# Patient Record
Sex: Male | Born: 2000 | Race: Black or African American | Hispanic: No | Marital: Single
Health system: Southern US, Community
[De-identification: ages and names within clinical notes are randomized; demographics above are authoritative.]

## PROBLEM LIST (undated history)

## (undated) DIAGNOSIS — J45909 Unspecified asthma, uncomplicated: Secondary | ICD-10-CM

## (undated) DIAGNOSIS — Z789 Other specified health status: Secondary | ICD-10-CM

## (undated) SURGERY — Surgical Case
Anesthesia: *Unknown

---
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
## 2020-02-19 ENCOUNTER — Other Ambulatory Visit: Payer: Self-pay

## 2020-02-19 ENCOUNTER — Emergency Department (HOSPITAL_COMMUNITY)
Admission: EM | Admit: 2020-02-19 | Discharge: 2020-02-19 | Disposition: A | Discharge status: Home / Self Care | Payer: Medicaid Other | Attending: Emergency Medicine | Admitting: Emergency Medicine

## 2020-02-19 ENCOUNTER — Emergency Department (HOSPITAL_COMMUNITY): Payer: Medicaid Other

## 2020-02-19 DIAGNOSIS — J45909 Unspecified asthma, uncomplicated: Secondary | ICD-10-CM | POA: Diagnosis not present

## 2020-02-19 DIAGNOSIS — Z7951 Long term (current) use of inhaled steroids: Secondary | ICD-10-CM | POA: Diagnosis not present

## 2020-02-19 DIAGNOSIS — Z7722 Contact with and (suspected) exposure to environmental tobacco smoke (acute) (chronic): Secondary | ICD-10-CM | POA: Diagnosis not present

## 2020-02-19 DIAGNOSIS — Z202 Contact with and (suspected) exposure to infections with a predominantly sexual mode of transmission: Secondary | ICD-10-CM | POA: Diagnosis not present

## 2020-02-19 DIAGNOSIS — M25511 Pain in right shoulder: Secondary | ICD-10-CM | POA: Diagnosis present

## 2020-02-19 DIAGNOSIS — Z7952 Long term (current) use of systemic steroids: Secondary | ICD-10-CM | POA: Insufficient documentation

## 2020-02-19 LAB — RPR: RPR Ser Ql: NONREACTIVE

## 2020-02-19 LAB — HIV ANTIBODY (ROUTINE TESTING W REFLEX): HIV Screen 4th Generation wRfx: NONREACTIVE

## 2020-02-19 MED ORDER — DOXYCYCLINE HYCLATE 100 MG PO TABS
100.0000 mg | ORAL_TABLET | Freq: Once | ORAL | Status: AC
  Administered 2020-02-19: 100 mg via ORAL
  Filled 2020-02-19: qty 1

## 2020-02-19 MED ORDER — DOXYCYCLINE HYCLATE 100 MG PO CAPS: 100.0000 mg | ORAL_CAPSULE | Freq: Two times a day (BID) | ORAL | 0 refills | Status: DC

## 2020-02-19 MED ORDER — NAPROXEN 500 MG PO TABS: 500.0000 mg | ORAL_TABLET | Freq: Two times a day (BID) | ORAL | 0 refills | Status: DC

## 2020-02-19 MED ORDER — NAPROXEN 250 MG PO TABS
500.0000 mg | ORAL_TABLET | Freq: Once | ORAL | Status: AC
  Administered 2020-02-19: 500 mg via ORAL
  Filled 2020-02-19: qty 2

## 2020-02-19 NOTE — Progress Notes (Signed)
Orthopedic Tech Progress Note Patient Details:  Steven Adams 09-Apr-2000 [id number]  Ortho Devices Type of Ortho Device: Sling immobilizer Ortho Device/Splint Location: RUE Ortho Device/Splint Interventions: Application,Adjustment   Post Interventions Patient Tolerated: Well Instructions Provided: Adjustment of device   [name] [name] 02/19/2020, 2:56 AM

## 2020-02-19 NOTE — Discharge Instructions (Addendum)
Apply ice to your shoulder for 30 minutes at a time, 4 times a day.  Wear the sling as needed.  However, make sure to put your shoulder through a full range of motion at least 4 times a day.  This is to prevent you from developing a frozen shoulder.  Follow-up with the orthopedic physician for further evaluation of your shoulder.

## 2020-02-19 NOTE — ED Notes (Signed)
Patient verbalized understanding of discharge instructions. Opportunity for questions and answers.  
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
## 2020-02-19 NOTE — ED Notes (Signed)
Patient transported to X-ray 
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
## 2020-02-19 NOTE — ED Notes (Signed)
Pt back from x-ray.

## 2020-02-21 LAB — GC/CHLAMYDIA PROBE AMP (~~LOC~~) NOT AT ARMC
Chlamydia: NEGATIVE
Comment: NEGATIVE
Comment: NORMAL
Neisseria Gonorrhea: NEGATIVE

## 2020-05-13 ENCOUNTER — Emergency Department (HOSPITAL_COMMUNITY)
Admission: EM | Admit: 2020-05-13 | Discharge: 2020-05-14 | Disposition: A | Discharge status: Home / Self Care | Payer: Medicaid Other | Attending: Emergency Medicine | Admitting: Emergency Medicine

## 2020-05-13 ENCOUNTER — Other Ambulatory Visit: Payer: Self-pay

## 2020-05-13 ENCOUNTER — Encounter (HOSPITAL_COMMUNITY): Payer: Self-pay

## 2020-05-13 DIAGNOSIS — T1491XA Suicide attempt, initial encounter: Secondary | ICD-10-CM | POA: Diagnosis not present

## 2020-05-13 DIAGNOSIS — R45851 Suicidal ideations: Secondary | ICD-10-CM

## 2020-05-13 DIAGNOSIS — S31139A Puncture wound of abdominal wall without foreign body, unspecified quadrant without penetration into peritoneal cavity, initial encounter: Secondary | ICD-10-CM | POA: Insufficient documentation

## 2020-05-13 DIAGNOSIS — J45909 Unspecified asthma, uncomplicated: Secondary | ICD-10-CM | POA: Diagnosis not present

## 2020-05-13 DIAGNOSIS — Z23 Encounter for immunization: Secondary | ICD-10-CM | POA: Diagnosis not present

## 2020-05-13 DIAGNOSIS — F4325 Adjustment disorder with mixed disturbance of emotions and conduct: Secondary | ICD-10-CM | POA: Diagnosis not present

## 2020-05-13 DIAGNOSIS — F332 Major depressive disorder, recurrent severe without psychotic features: Secondary | ICD-10-CM | POA: Diagnosis present

## 2020-05-13 DIAGNOSIS — X789XXA Intentional self-harm by unspecified sharp object, initial encounter: Secondary | ICD-10-CM | POA: Insufficient documentation

## 2020-05-13 DIAGNOSIS — S51812A Laceration without foreign body of left forearm, initial encounter: Secondary | ICD-10-CM | POA: Insufficient documentation

## 2020-05-13 DIAGNOSIS — Z7951 Long term (current) use of inhaled steroids: Secondary | ICD-10-CM | POA: Diagnosis not present

## 2020-05-13 DIAGNOSIS — Z7722 Contact with and (suspected) exposure to environmental tobacco smoke (acute) (chronic): Secondary | ICD-10-CM | POA: Insufficient documentation

## 2020-05-13 DIAGNOSIS — S59912A Unspecified injury of left forearm, initial encounter: Secondary | ICD-10-CM | POA: Diagnosis present

## 2020-05-13 DIAGNOSIS — S51831A Puncture wound without foreign body of right forearm, initial encounter: Secondary | ICD-10-CM | POA: Insufficient documentation

## 2020-05-13 MED ORDER — DIPHENHYDRAMINE HCL 50 MG/ML IJ SOLN
50.0000 mg | Freq: Once | INTRAMUSCULAR | Status: AC
  Administered 2020-05-14: 50 mg via INTRAMUSCULAR
  Filled 2020-05-13: qty 1

## 2020-05-13 MED ORDER — HALOPERIDOL LACTATE 5 MG/ML IJ SOLN
5.0000 mg | Freq: Once | INTRAMUSCULAR | Status: AC
  Administered 2020-05-13: 5 mg via INTRAMUSCULAR
  Filled 2020-05-13: qty 1

## 2020-05-13 MED ORDER — LORAZEPAM 2 MG/ML IJ SOLN
2.0000 mg | Freq: Once | INTRAMUSCULAR | Status: AC
  Administered 2020-05-13: 2 mg via INTRAMUSCULAR
  Filled 2020-05-13: qty 1

## 2020-05-13 MED ORDER — TETANUS-DIPHTH-ACELL PERTUSSIS 5-2.5-18.5 LF-MCG/0.5 IM SUSY
0.5000 mL | PREFILLED_SYRINGE | Freq: Once | INTRAMUSCULAR | Status: AC
  Administered 2020-05-14: 0.5 mL via INTRAMUSCULAR
  Filled 2020-05-13: qty 0.5

## 2020-05-13 NOTE — ED Provider Notes (Signed)
[facility]-EMERGENCY DEPT Provider Note   CSN: [id number] Arrival date & time: 05/13/20  2247     History Chief Complaint  Patient presents with  . Suicidal  . Extremity Laceration    Torrence Hammack is a 20 y.o. male hx of asthma, depression who presented with suicidal ideation and suicidal attempt.  Patient has been depressed and states that he has been going through a lot.  He told his girlfriend " I am done" and told me that he does not want to live anymore.  He apparently started stabbing himself in the abdomen and his arms.  His girlfriend was concerned and called police to bring him over.  He states that he did not want to be evaluated and wants to just go home and does not understand why he is here.  Patient had previous psych admissions.  He denies any alcohol or drug use currently.  Patient does not remember when his last tetanus shot was  The history is provided by the patient.       Past Medical History:  Diagnosis Date  . Asthma   . Medical history non-contributory     Patient Active Problem List   Diagnosis Date Noted  . MDD (major depressive disorder) 08/04/2018  . MDD (major depressive disorder), recurrent episode (HCC) 08/03/2018    History reviewed. No pertinent surgical history.     History reviewed. No pertinent family history.  Social History   Tobacco Use  . Smoking status: Passive Smoke Exposure - Never Smoker  . Smokeless tobacco: Never Used  Vaping Use  . Vaping Use: Every day  . Substances: THC  Substance Use Topics  . Alcohol use: Yes  . Drug use: Yes    Types: Amphetamines, Benzodiazepines, Marijuana, MDMA (Ecstacy), Oxycodone    Home Medications Prior to Admission medications   Medication Sig Start Date End Date Taking? Authorizing Provider  albuterol (PROVENTIL HFA;VENTOLIN HFA) 108 (90 BASE) MCG/ACT inhaler Inhale 2 puffs into the lungs every 6 (six) hours as needed for wheezing.    [provider]   budesonide-formoterol (SYMBICORT) 80-4.5 MCG/ACT inhaler Inhale 2 puffs into the lungs daily.    [provider]  cetirizine (ZYRTEC) 10 MG tablet Take 10 mg by mouth at bedtime as needed for allergies. 09/14/19   [provider]  doxycycline (VIBRAMYCIN) 100 MG capsule Take 1 capsule (100 mg total) by mouth 2 (two) times daily. One po bid x 7 days 02/19/20   Dione Booze, MD  EPINEPHrine (EPIPEN) 0.3 mg/0.3 mL IJ SOAJ injection Inject 0.3 mLs (0.3 mg total) into the muscle once. Give at first sign of anaphylaxis 08/31/13   Marcellina Millin, MD  fluticasone [facility]) 50 MCG/ACT nasal spray Place 1 spray into both nostrils daily as needed for allergies. 09/14/19   [provider]  naproxen (NAPROSYN) 500 MG tablet Take 1 tablet (500 mg total) by mouth 2 (two) times daily. 02/19/20   Dione Booze, MD    Allergies    Short ragweed pollen ext  Review of Systems   Review of Systems  Skin: Positive for wound.  All other systems reviewed and are negative.   Physical Exam Updated Vital Signs BP 102/64 (BP Location: Right Arm)   Pulse 68   Temp 98.7 F (37.1 C) (Oral)   Resp 18   Ht 5\' 9"  (1.753 m)   Wt 59 kg   SpO2 98%   BMI 19.21 kg/m   Physical Exam Vitals and nursing note reviewed.  Constitutional:      Comments: Agitated  HENT:     Head: Normocephalic.     Nose: Nose normal.     Mouth/Throat:     Mouth: Mucous membranes are moist.  Eyes:     Extraocular Movements: Extraocular movements intact.     Pupils: Pupils are equal, round, and reactive to light.  Cardiovascular:     Rate and Rhythm: Normal rate and regular rhythm.     Pulses: Normal pulses.     Heart sounds: Normal heart sounds.  Pulmonary:     Effort: Pulmonary effort is normal.     Breath sounds: Normal breath sounds.  Abdominal:     General: Abdomen is flat.     Palpations: Abdomen is soft.  Musculoskeletal:     Cervical back: Normal range of motion and neck supple.     Comments:  Patient has multiple superficial cuts on the left forearm  Skin:    Capillary Refill: Capillary refill takes less than 2 seconds.     Comments: Patient has small puncture wounds abdomen and bilateral forearms.  None of them are deep and there is no abdominal tenderness.  There is no fascia exposed or bowel exposed  Neurological:     General: No focal deficit present.     Mental Status: He is oriented to person, place, and time.  Psychiatric:     Comments: Agitated and cursing     ED Results / Procedures / Treatments   Labs (all labs ordered are listed, but only abnormal results are displayed) Labs Reviewed  CBC WITH DIFFERENTIAL/PLATELET - Abnormal; Notable for the following components:      Result Value   MCH 34.1 (*)    Neutro Abs 1.6 (*)    All other components within normal limits  COMPREHENSIVE METABOLIC PANEL - Abnormal; Notable for the following components:   Potassium 3.0 (*)    All other components within normal limits  SALICYLATE LEVEL - Abnormal; Notable for the following components:   Salicylate Lvl <7.0 (*)    All other components within normal limits  ACETAMINOPHEN LEVEL - Abnormal; Notable for the following components:   Acetaminophen (Tylenol), Serum <10 (*)    All other components within normal limits  ETHANOL  RAPID URINE DRUG SCREEN, HOSP PERFORMED    EKG None  Radiology No results found.  Procedures Procedures   Medications Ordered in ED Medications  potassium chloride SA (KLOR-CON) CR tablet 40 mEq (has no administration in time range)  Tdap (BOOSTRIX) injection 0.5 mL (0.5 mLs Intramuscular Given 05/14/20 0000)  LORazepam (ATIVAN) injection 2 mg (2 mg Intramuscular Given 05/13/20 2359)  haloperidol lactate (HALDOL) injection 5 mg (5 mg Intramuscular Given 05/13/20 2359)  diphenhydrAMINE (BENADRYL) injection 50 mg (50 mg Intramuscular Given 05/14/20 0000)    ED Course  I have reviewed the triage vital signs and the nursing notes.  Pertinent labs  & imaging results that were available during my care of the patient were reviewed by me and considered in my medical decision making (see chart for details).    MDM Rules/Calculators/A&P                         Kaydenn Mclear is a 20 y.o. male here presenting with suicidal attempt.  Patient attempted to cut himself multiple times.  None of his cuts are deep and in particular, there is no obvious bowel or fascia exposing the abdomen.  We will update his tetanus  shot.  Patient's lacerations did not need any suturing.  Patient is agitated and cursing.  IVC paperwork filled out by me.  Will give Ativan Haldol and Benadryl for agitation  7:06 AM Patient's labs show potassium 3.0 that was supplemented.  Patient is medically cleared.   Final Clinical Impression(s) / ED Diagnoses Final diagnoses:  None    Rx / DC Orders ED Discharge Orders    None       Charlynne Pander, MD 05/14/20 [id number]

## 2020-05-13 NOTE — ED Notes (Signed)
Patient is screaming "none of yall know me. I dont want to be here right now. I did not want to come to the hospital. What does that have to do with yall that I am stabbing myself." Dr. Silverio Lay at bedside, educating patient about what it means to be IVC'd. Patient saying "fuck that doctor. Give me my shit back now bro yall pissed me off."

## 2020-05-13 NOTE — ED Triage Notes (Signed)
Patient BIB [facility] from a hotel, where he is currently living. His girlfriend called because patient cut himself on both arms and center of his abdomen. 4 puncture wounds. Patient is suicidal. History of bipolar. Patient keeps saying "I am done."  EMS vitals 118/72 HR 80 O2 99%

## 2020-05-13 NOTE — ED Notes (Addendum)
Pt advised that blood work was needed. Pt inquired about how long the whole process would take with his visit. He states " I don't like hospitals and if it is going to take too long then I  may just have to leave." Pt states " man my girlfriend called the ambulance and I told her not to and she did it anyway."

## 2020-05-14 DIAGNOSIS — F332 Major depressive disorder, recurrent severe without psychotic features: Secondary | ICD-10-CM | POA: Diagnosis present

## 2020-05-14 DIAGNOSIS — F4325 Adjustment disorder with mixed disturbance of emotions and conduct: Secondary | ICD-10-CM | POA: Diagnosis present

## 2020-05-14 LAB — COMPREHENSIVE METABOLIC PANEL
ALT: 17 U/L (ref 0–44)
AST: 28 U/L (ref 15–41)
Albumin: 3.8 g/dL (ref 3.5–5.0)
Alkaline Phosphatase: 59 U/L (ref 38–126)
Anion gap: 12 (ref 5–15)
BUN: 7 mg/dL (ref 6–20)
CO2: 27 mmol/L (ref 22–32)
Calcium: 9.1 mg/dL (ref 8.9–10.3)
Chloride: 101 mmol/L (ref 98–111)
Creatinine, Ser: 0.81 mg/dL (ref 0.61–1.24)
GFR, Estimated: >60 mL/min (ref >60)
Glucose, Bld: 85 mg/dL (ref 70–99)
Potassium: 3 mmol/L — ABNORMAL LOW (ref 3.5–5.1)
Sodium: 140 mmol/L (ref 135–145)
Total Bilirubin: 0.8 mg/dL (ref 0.3–1.2)
Total Protein: 6.8 g/dL (ref 6.5–8.1)

## 2020-05-14 LAB — CBC WITH DIFFERENTIAL/PLATELET
Abs Immature Granulocytes: 0.02 10*3/uL (ref 0.00–0.07)
Basophils Absolute: 0 10*3/uL (ref 0.0–0.1)
Basophils Relative: 1 %
Eosinophils Absolute: 0.2 10*3/uL (ref 0.0–0.5)
Eosinophils Relative: 5 %
HCT: 45.8 % (ref 39.0–52.0)
Hemoglobin: 15.8 g/dL (ref 13.0–17.0)
Immature Granulocytes: 0 %
Lymphocytes Relative: 51 %
Lymphs Abs: 2.4 10*3/uL (ref 0.7–4.0)
MCH: 34.1 pg — ABNORMAL HIGH (ref 26.0–34.0)
MCHC: 34.5 g/dL (ref 30.0–36.0)
MCV: 98.9 fL (ref 80.0–100.0)
Monocytes Absolute: 0.4 10*3/uL (ref 0.1–1.0)
Monocytes Relative: 8 %
Neutro Abs: 1.6 10*3/uL — ABNORMAL LOW (ref 1.7–7.7)
Neutrophils Relative %: 35 %
Platelets: 180 10*3/uL (ref 150–400)
RBC: 4.63 MIL/uL (ref 4.22–5.81)
RDW: 13.1 % (ref 11.5–15.5)
WBC: 4.7 10*3/uL (ref 4.0–10.5)
nRBC: 0 % (ref 0.0–0.2)

## 2020-05-14 LAB — SALICYLATE LEVEL: Salicylate Lvl: <7 mg/dL — ABNORMAL LOW (ref 7.0–30.0)

## 2020-05-14 LAB — ACETAMINOPHEN LEVEL: Acetaminophen (Tylenol), Serum: <10 ug/mL — ABNORMAL LOW (ref 10–30)

## 2020-05-14 LAB — ETHANOL: Alcohol, Ethyl (B): <10 mg/dL (ref <10)

## 2020-05-14 MED ORDER — ACETAMINOPHEN 325 MG PO TABS: 650.0000 mg | ORAL_TABLET | Freq: Four times a day (QID) | ORAL | Status: DC | PRN

## 2020-05-14 MED ORDER — POTASSIUM CHLORIDE CRYS ER 20 MEQ PO TBCR
40.0000 meq | EXTENDED_RELEASE_TABLET | Freq: Once | ORAL | Status: AC
  Administered 2020-05-14: 40 meq via ORAL
  Filled 2020-05-14: qty 2

## 2020-05-14 MED ORDER — LORAZEPAM 1 MG PO TABS: 1.0000 mg | ORAL_TABLET | ORAL | Status: DC | PRN

## 2020-05-14 NOTE — [facility] Counselor (Signed)
Attempted assessment.  Per RN's report, Pt is still asleep.  Will attempt assessment later.

## 2020-05-14 NOTE — Discharge Instructions (Signed)
[facility] Mental health clinic in [place], [place] Address: [address], [place], [place] [address] Hours:  Open 24 hours Phone: [phone] 

## 2020-05-14 NOTE — ED Notes (Signed)
Patient changed into burgundy scrubs. 1 gray hoody, 1 black pair of pants, 1 pair of black shoes, 1 cellphone, 1 charging cord.

## 2020-05-14 NOTE — ED Notes (Signed)
Pt sleeping, respirations even and unlabored.

## 2020-05-14 NOTE — BH Assessment (Signed)
Comprehensive Clinical Assessment (CCA) Note  05/14/2020 Steven Adams [id number]  Disposition:  Pt was initially assessed by Molli KnockJ. Lord, DNP, and author then completed TTS assessment.  Per Molli KnockJ. Lord, DNP, Pt does not meet inpatient criteria and may be discharged.  The patient demonstrates the following risk factors for suicide: Chronic risk factors for suicide include: previous suicide attempts 2 and previous self-harm multiple. Acute risk factors for suicide include: N/A. Protective factors for this patient include: positive social support and coping skills. Considering these factors, the overall suicide risk at this point appears to be moderate. Patient is appropriate for outpatient follow up.  Flowsheet Row ED from 05/13/2020 [facility] Admission (Discharged) [facility]  C-SSRS RISK CATEGORY Moderate Risk High Risk     Chief Complaint:  Chief Complaint  Patient presents with  . Suicidal  . Extremity Laceration  . 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  He lives in [place] with a roommate, and he works at [employer].  Pt said he does have a psychaitrist.  Pt reported that he argued with his girlfriend yesterday.  He got upset and cut himself on his arm and abdomen.  Pt denied that this was a suicide attempt.  Pt admitted that he has attempted suicide before (last attempt was 2020).  Pt denied homicidal ideation and hallucination.  Pt endorsed use of marijuana.  Pt stated that he wanted to be discharged.  During assessment, Pt presented as alert and oriented.  He had good eye contact and was cooperative.  Pt was dressed in scrubs, and he was appropriately  groomed.  Pt's mood was generally negative.  Mood was blunted.  Pt's speech was normal in rate, rhythm, and volume.  Thought processes were within normal range, and thought content was logical and goal-oriented.  There was no evidence of delusion.  Pt's memory and concentration were intact.  Insight, judgment, and impulse control were fair.  Per Molli KnockJ. Lord, DNP, Pt is to be discharged.  CCA Screening, Triage and Referral (STR)  Patient Reported Information How did you hear about us? Family/Friend  Referral name: No data recorded Referral phone number: No data recorded  Whom do you see for routine medical problems? Primary Care  Practice/Facility Name: No data recorded Practice/Facility Phone Number: No data recorded Name of Contact: No data recorded Contact Number: No data recorded Contact Fax Number: No data recorded Prescriber Name: No data recorded Prescriber Address (if known): No data recorded  What Is the Reason for Your Visit/Call Today? Pt became upset, cut himself  How Long Has This Been Causing You Problems? <Week  What Do You Feel Would Help You the Most Today? No data recorded  Have You Recently Been in Any Inpatient Treatment (Hospital/Detox/Crisis Center/28-Day Program)? No data recorded Name/Location of Program/Hospital:No data recorded How Long Were You There? No data recorded When Were You Discharged? No data recorded  Have You Ever Received Services From [facility] Before? Yes  [facility]   Have You Recently Had Any Thoughts About Hurting Yourself? Yes  Are You Planning to Commit Suicide/Harm Yourself At This time? No   Have you Recently  Had Thoughts About Hurting Someone Karolee Ohs? No  Explanation: No data recorded  Have You Used Any Alcohol or Drugs in the Past 24 Hours? No  How Long Ago Did You Use Drugs or Alcohol? No data recorded What Did You Use and How Much? No data recorded  Do You Currently Have a Therapist/Psychiatrist?  No  Name of Therapist/Psychiatrist: No data recorded  Have You Been Recently Discharged From Any Office Practice or Programs? No  Explanation of Discharge From Practice/Program: No data recorded    CCA Screening Triage Referral Assessment Type of Contact: Tele-Assessment  Is this Initial or Reassessment? Initial Assessment  Date Telepsych consult ordered in [facility]:  05/14/2020  Time Telepsych consult ordered in [facility]:  No data recorded  Patient Reported Information Reviewed? Yes  Patient Left Without Being Seen? No data recorded Reason for Not Completing Assessment: No data recorded  Collateral Involvement: NA   Does Patient Have a Court Appointed Legal Guardian? No data recorded Name and Contact of Legal Guardian: No data recorded If Minor and Not Living with Parent(s), Who has Custody? No data recorded Is CPS involved or ever been involved? In the Past  Is APS involved or ever been involved? Never   Patient Determined To Be At Risk for Harm To Self or Others Based on Review of Patient Reported Information or Presenting Complaint? No data recorded Method: No data recorded Availability of Means: No data recorded Intent: No data recorded Notification Required: No data recorded Additional Information for Danger to Others Potential: No data recorded Additional Comments for Danger to Others Potential: No data recorded Are There Guns or Other Weapons in Your Home? No data recorded Types of Guns/Weapons: No data recorded Are These Weapons Safely Secured?                            No data recorded Who Could Verify You Are Able To Have These Secured: No data recorded Do You Have any Outstanding Charges, Pending Court Dates, Parole/Probation? No data recorded Contacted To Inform of Risk of Harm To Self or Others: No data recorded  Location of Assessment: [facility]   Does Patient Present under Involuntary Commitment? No  IVC Papers Initial File Date: No data recorded  [place] of  Residence: [place]   Patient Currently Receiving the Following Services: Medication Management   Determination of Need: Urgent (48 hours)   Options For Referral: Outpatient Therapy     CCA Biopsychosocial Intake/Chief Complaint:  Pt cut himself on arm and belly after arguing with girlfriend  Current Symptoms/Problems: No data recorded  Patient Reported Schizophrenia/Schizoaffective Diagnosis in Past: No   Strengths: No data recorded Preferences: No data recorded Abilities: No data recorded  Type of Services Patient Feels are Needed: Pt wants to go home   Initial Clinical Notes/Concerns: Pt denied suicidal ideation, homicidal ideation, hallucination, and substance use   Mental Health Symptoms Depression:  Change in energy/activity   Duration of Depressive symptoms: Greater than two weeks   Mania:  N/A   Anxiety:   N/A   Psychosis:  None   Duration of Psychotic symptoms: No data recorded  Trauma:  N/A   Obsessions:  N/A   Compulsions:  N/A   Inattention:  N/A   Hyperactivity/Impulsivity:  N/A   Oppositional/Defiant Behaviors:  N/A   Emotional Irregularity:  Potentially harmful impulsivity; Recurrent suicidal behaviors/gestures/threats   Other Mood/Personality Symptoms:  No data recorded   Mental Status Exam Appearance and  self-care  Stature:  Average   Weight:  Average weight   Clothing:  Casual   Grooming:  Normal   Cosmetic use:  None   Posture/gait:  Normal   Motor activity:  Not Remarkable   Sensorium  Attention:  Normal   Concentration:  Normal   Orientation:  X5   Recall/memory:  Normal   Affect and Mood  Affect:  Appropriate   Mood:  Negative   Relating  Eye contact:  Normal   Facial expression:  Responsive   Attitude toward examiner:  Cooperative   Thought and Language  Speech flow: Clear and Coherent   Thought content:  Appropriate to Mood and Circumstances   Preoccupation:  None   Hallucinations:  None    Organization:  No data recorded  [employer] of Knowledge:  Average   Intelligence:  Average   Abstraction:  Normal   Judgement:  Fair   Dance movement psychotherapist:  Adequate   Insight:  Fair   Decision Making:  Impulsive   Social Functioning  Social Maturity:  Impulsive   Social Judgement:  Victimized   Stress  Stressors:  Relationship   Coping Ability:  Human resources officer Deficits:  None   Supports:  Family; Friends/Service system     Religion:    Leisure/Recreation:    Exercise/Diet: Exercise/Diet Do You Follow a Special Diet?: No Do You Have Any Trouble Sleeping?: No   CCA Employment/Education Employment/Work Situation: Employment / Work Situation Employment situation: Employed Where is patient currently employed?: [employer] job has been impacted by current illness: No Has patient ever been in the [employer]?: No  Education: Education Is Patient Currently Attending School?: No Did You Have An Individualized Education Program (IIEP): No Did You Have Any Difficulty At [employer]?: No Patient's Education Has Been Impacted by Current Illness: No   CCA Family/Childhood History Family and Relationship History: Family history Are you sexually active?: Yes What is your sexual orientation?: Heterosexual Does patient have children?: No  Childhood History:  Childhood History Did patient suffer any verbal/emotional/physical/sexual abuse as a child?: No Did patient suffer from severe childhood neglect?: No Has patient ever been sexually abused/assaulted/raped as an adolescent or adult?: No Was the patient ever a victim of a crime or a disaster?: No Witnessed domestic violence?: No Has patient been affected by domestic violence as an adult?: No  Child/Adolescent Assessment:     CCA Substance Use Alcohol/Drug Use: Alcohol / Drug Use Pain Medications: Please see MAR Prescriptions: Please see MAR Over the Counter: Please see  MAR History of alcohol / drug use?: Yes Substance #1 Name of Substance 1: THC 1 - Amount (size/oz): Varied 1 - Frequency: Episodic 1 - Duration: Ongoing                       ASAM's:  Six Dimensions of Multidimensional Assessment  Dimension 1:  Acute Intoxication and/or Withdrawal Potential:      Dimension 2:  Biomedical Conditions and Complications:      Dimension 3:  Emotional, Behavioral, or Cognitive Conditions and Complications:     Dimension 4:  Readiness to Change:     Dimension 5:  Relapse, Continued use, or Continued Problem Potential:     Dimension 6:  Recovery/Living Environment:     ASAM Severity Score:    ASAM Recommended Level of Treatment:     Substance use Disorder (SUD)    Recommendations for Services/Supports/Treatments:    DSM5 Diagnoses: Patient  Active Problem List   Diagnosis Date Noted  . 

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
## 2020-05-14 NOTE — ED Notes (Signed)
Pt wanting to go to work. Writer called Molli Knock and spoke with her and gave phone number of person to contact for PT. TTS visit just completed. Pt has been redirectable.

## 2020-05-14 NOTE — ED Provider Notes (Signed)
PT cleared by psychiatry team per Nanine Means, NP. I rescinded IVC.   Akia Montalban, Ambrose Finland, MD 05/14/20 [phone]

## 2020-09-04 IMAGING — DX PORTABLE CHEST - 1 VIEW
1 series · 1 of 1 positions shown · non-contrast
Comparison: 11/05/2012.

CLINICAL DATA: Asthma exacerbation. Chest tightness. Shortness of
breath.

EXAM:
PORTABLE CHEST 1 VIEW

[chest ap]
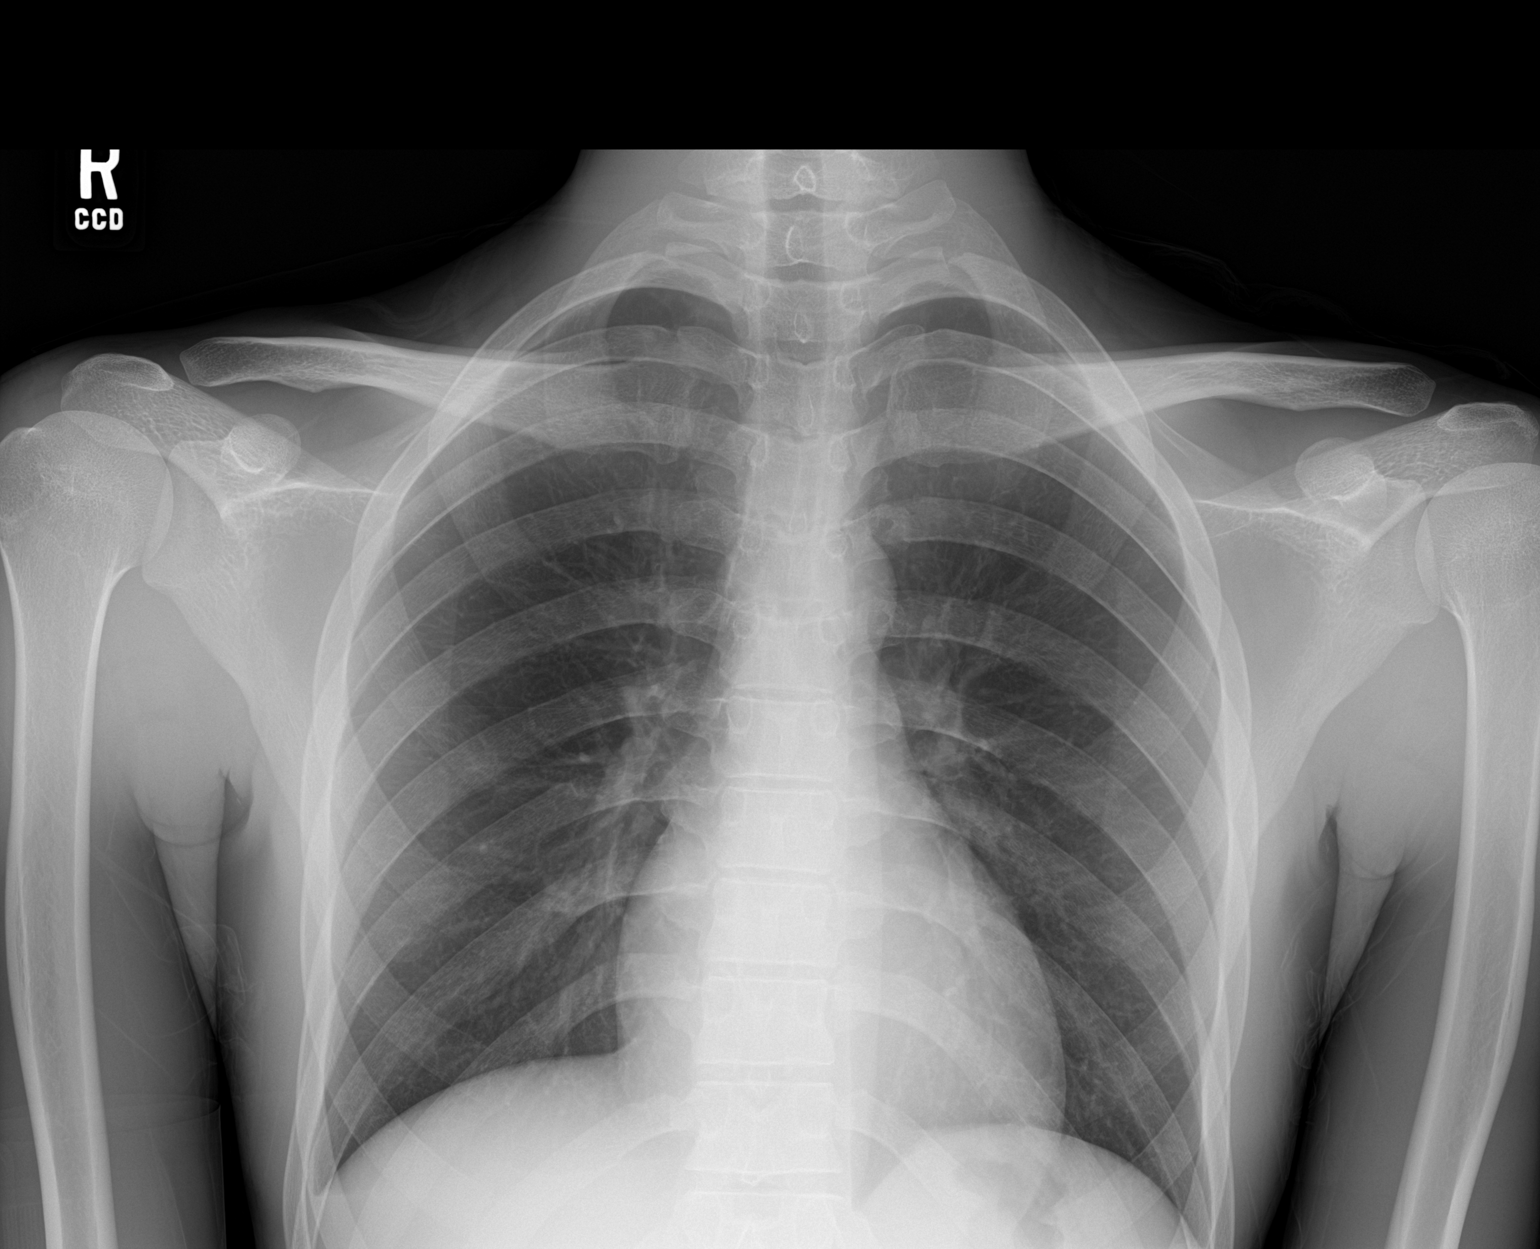

[1 of 1 positions shown; findings below may reference images not displayed]

FINDINGS: Left costophrenic angle incompletely imaged. Mediastinum hilar
structures normal. Lungs are clear of acute infiltrates. No pleural
effusion or pneumothorax. Heart size normal. No acute bony
abnormality.
IMPRESSION: No acute cardiopulmonary disease.
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
## 2021-09-27 ENCOUNTER — Other Ambulatory Visit: Payer: Self-pay

## 2021-09-27 ENCOUNTER — Encounter (HOSPITAL_COMMUNITY): Payer: Self-pay

## 2021-09-27 ENCOUNTER — Emergency Department (HOSPITAL_COMMUNITY)
Admission: EM | Admit: 2021-09-27 | Discharge: 2021-09-28 | Disposition: A | Discharge status: Home / Self Care | Payer: Medicaid Other | Attending: Emergency Medicine | Admitting: Emergency Medicine

## 2021-09-27 DIAGNOSIS — R112 Nausea with vomiting, unspecified: Secondary | ICD-10-CM | POA: Insufficient documentation

## 2021-09-27 DIAGNOSIS — Z7722 Contact with and (suspected) exposure to environmental tobacco smoke (acute) (chronic): Secondary | ICD-10-CM | POA: Insufficient documentation

## 2021-09-27 DIAGNOSIS — R109 Unspecified abdominal pain: Secondary | ICD-10-CM | POA: Insufficient documentation

## 2021-09-27 DIAGNOSIS — J45909 Unspecified asthma, uncomplicated: Secondary | ICD-10-CM | POA: Insufficient documentation

## 2021-09-27 LAB — URINALYSIS, ROUTINE W REFLEX MICROSCOPIC
Bacteria, UA: NONE SEEN
Bilirubin Urine: NEGATIVE
Glucose, UA: NEGATIVE mg/dL
Ketones, ur: 20 mg/dL — AB
Leukocytes,Ua: NEGATIVE
Nitrite: NEGATIVE
Protein, ur: 30 mg/dL — AB
Specific Gravity, Urine: 1.019 (ref 1.005–1.030)
pH: 6 (ref 5.0–8.0)

## 2021-09-27 LAB — RAPID URINE DRUG SCREEN, HOSP PERFORMED
Amphetamines: NOT DETECTED
Barbiturates: NOT DETECTED
Benzodiazepines: NOT DETECTED
Cocaine: NOT DETECTED
Opiates: NOT DETECTED
Tetrahydrocannabinol: POSITIVE — AB

## 2021-09-27 LAB — COMPREHENSIVE METABOLIC PANEL
ALT: 20 U/L (ref 0–44)
AST: 24 U/L (ref 15–41)
Albumin: 4.6 g/dL (ref 3.5–5.0)
Alkaline Phosphatase: 61 U/L (ref 38–126)
Anion gap: 10 (ref 5–15)
BUN: 12 mg/dL (ref 6–20)
CO2: 24 mmol/L (ref 22–32)
Calcium: 10.1 mg/dL (ref 8.9–10.3)
Chloride: 106 mmol/L (ref 98–111)
Creatinine, Ser: 0.77 mg/dL (ref 0.61–1.24)
GFR, Estimated: >60 mL/min (ref >60)
Glucose, Bld: 127 mg/dL — ABNORMAL HIGH (ref 70–99)
Potassium: 3.4 mmol/L — ABNORMAL LOW (ref 3.5–5.1)
Sodium: 140 mmol/L (ref 135–145)
Total Bilirubin: 0.9 mg/dL (ref 0.3–1.2)
Total Protein: 7.8 g/dL (ref 6.5–8.1)

## 2021-09-27 LAB — CBC WITH DIFFERENTIAL/PLATELET
Abs Immature Granulocytes: 0.02 10*3/uL (ref 0.00–0.07)
Basophils Absolute: 0 10*3/uL (ref 0.0–0.1)
Basophils Relative: 0 %
Eosinophils Absolute: 0.1 10*3/uL (ref 0.0–0.5)
Eosinophils Relative: 1 %
HCT: 44.5 % (ref 39.0–52.0)
Hemoglobin: 15.6 g/dL (ref 13.0–17.0)
Immature Granulocytes: 0 %
Lymphocytes Relative: 17 %
Lymphs Abs: 1 10*3/uL (ref 0.7–4.0)
MCH: 33.7 pg (ref 26.0–34.0)
MCHC: 35.1 g/dL (ref 30.0–36.0)
MCV: 96.1 fL (ref 80.0–100.0)
Monocytes Absolute: 0.3 10*3/uL (ref 0.1–1.0)
Monocytes Relative: 4 %
Neutro Abs: 4.6 10*3/uL (ref 1.7–7.7)
Neutrophils Relative %: 78 %
Platelets: 181 10*3/uL (ref 150–400)
RBC: 4.63 MIL/uL (ref 4.22–5.81)
RDW: 12.2 % (ref 11.5–15.5)
WBC: 6 10*3/uL (ref 4.0–10.5)
nRBC: 0 % (ref 0.0–0.2)

## 2021-09-27 LAB — LIPASE, BLOOD: Lipase: 24 U/L (ref 11–51)

## 2021-09-27 MED ORDER — ONDANSETRON 4 MG PO TBDP
4.0000 mg | ORAL_TABLET | Freq: Once | ORAL | Status: AC | PRN
Start: 2021-09-27 — End: 2021-09-27
  Administered 2021-09-27: 4 mg via ORAL
  Filled 2021-09-27: qty 1

## 2021-09-27 NOTE — ED Triage Notes (Signed)
6 episodes of vomiting since ~5PM. C/o generalized abdominal pain.

## 2021-09-27 NOTE — ED Provider Triage Note (Signed)
Emergency Medicine Provider Triage Evaluation Note  Steven Adams , a 21 y.o. male  was evaluated in triage.  Pt complains of vomiting.  This started this afternoon.  No fevers.  No diarrhea.     Physical Exam  BP 118/70   Pulse 62   Temp 98.3 F (36.8 C) (Oral)   Resp 19   Ht 5\' 8"  (1.727 m)   Wt 68 kg   SpO2 100%   BMI 22.81 kg/m  Gen:   Awake, appears uncomfortable Resp:  Normal effort  MSK:   Moves extremities without difficulty  Other:  Normal speech.   Medical Decision Making  Medically screening exam initiated at 10:56 PM.  Appropriate orders placed.  Steven Adams was informed that the remainder of the evaluation will be completed by another provider, this initial triage assessment does not replace that evaluation, and the importance of remaining in the ED until their evaluation is complete.     Quentin Mulling, Cristina Gong 09/27/21 2259

## 2021-09-28 MED ORDER — ONDANSETRON 4 MG PO TBDP: 4.0000 mg | ORAL_TABLET | Freq: Three times a day (TID) | ORAL | 0 refills | Status: AC | PRN

## 2021-09-28 MED ORDER — HALOPERIDOL LACTATE 5 MG/ML IJ SOLN
4.0000 mg | Freq: Once | INTRAMUSCULAR | Status: AC
  Administered 2021-09-28: 4 mg via INTRAMUSCULAR
  Filled 2021-09-28: qty 1

## 2021-09-28 NOTE — ED Provider Notes (Signed)
[facility]-EMERGENCY DEPT [facility] Emergency Department Provider Note MRN:  [id number]  Arrival date & time: 09/28/21     Chief Complaint   Vomiting   History of Present Illness   Steven Adams is a 21 y.o. year-old male with a history of asthma presenting to the ED with chief complaint of vomiting.  Nausea vomiting and abdominal pain for the past several hours.  No fever, no constipation or diarrhea, no chest pain or shortness of breath.  Is never happened before.  Review of Systems  A thorough review of systems was obtained and all systems are negative except as noted in the HPI and PMH.   Patient's Health History    Past Medical History:  Diagnosis Date   Asthma    Medical history non-contributory     History reviewed. No pertinent surgical history.  No family history on file.  Social History   Socioeconomic History   Marital status: Single    Spouse name: Not on file   Number of children: Not on file   Years of education: Not on file   Highest education level: Not on file  Occupational History   Not on file  Tobacco Use   Smoking status: Passive Smoke Exposure - Never Smoker   Smokeless tobacco: Never  Vaping Use   Vaping Use: Every day   Substances: THC  Substance and Sexual Activity   Alcohol use: Yes   Drug use: Yes    Types: Amphetamines, Benzodiazepines, Marijuana, MDMA (Ecstacy), Oxycodone   Sexual activity: Yes    Birth control/protection: None  Other Topics Concern   Not on file  Social History Narrative   Not on file   Social Determinants of Health   Financial Resource Strain: Not on file  Food Insecurity: Not on file  Transportation Needs: Not on file  Physical Activity: Not on file  Stress: Not on file  Social Connections: Not on file  Intimate Partner Violence: Not on file     Physical Exam   Vitals:   09/28/21 0400 09/28/21 0412  BP:  132/76  Pulse: 61 (!) 59  Resp:  20  Temp:    SpO2: 98% 98%    CONSTITUTIONAL:  Well-appearing, NAD NEURO/PSYCH:  Alert and oriented x 3, no focal deficits EYES:  eyes equal and reactive ENT/NECK:  no LAD, no JVD CARDIO: Regular rate, well-perfused, normal S1 and S2 PULM:  CTAB no wheezing or rhonchi GI/GU:  non-distended, non-tender MSK/SPINE:  No gross deformities, no edema SKIN:  no rash, atraumatic   *Additional and/or pertinent findings included in MDM below  Diagnostic and Interventional Summary    EKG Interpretation  Date/Time:    Ventricular Rate:    PR Interval:    QRS Duration:   QT Interval:    QTC Calculation:   R Axis:     Text Interpretation:         Labs Reviewed  COMPREHENSIVE METABOLIC PANEL - Abnormal; Notable for the following components:      Result Value   Potassium 3.4 (*)    Glucose, Bld 127 (*)    All other components within normal limits  URINALYSIS, ROUTINE W REFLEX MICROSCOPIC - Abnormal; Notable for the following components:   Hgb urine dipstick SMALL (*)    Ketones, ur 20 (*)    Protein, ur 30 (*)    All other components within normal limits  RAPID URINE DRUG SCREEN, HOSP PERFORMED - Abnormal; Notable for the following components:   Tetrahydrocannabinol POSITIVE (*)  All other components within normal limits  LIPASE, BLOOD  CBC WITH DIFFERENTIAL/PLATELET    No orders to display    Medications  ondansetron (ZOFRAN-ODT) disintegrating tablet 4 mg (4 mg Oral Given 09/27/21 2254)  haloperidol lactate (HALDOL) injection 4 mg (4 mg Intramuscular Given 09/28/21 0307)     Procedures  /  Critical Care Procedures  ED Course and Medical Decision Making  Initial Impression and Ddx Patient appears generally nauseated but is not in obvious distress.  Normal vital signs.  Abdomen is nontender, soft.  Daily user of marijuana, and so hyperemesis is considered.  With the reassuring abdominal exam there is currently low concern for acute intra-abdominal surgical process.  Awaiting labs, providing symptomatic management.  Past  medical/surgical history that increases complexity of ED encounter: None  Interpretation of Diagnostics I personally reviewed the laboratory assessment and my interpretation is as follows: No significant blood count or electrolyte disturbance    Patient Reassessment and Ultimate Disposition/Management     On reassessment patient is feeling much better, continued benign abdominal exam, appropriate for discharge.  Patient management required discussion with the following services or consulting groups:  None  Complexity of Problems Addressed Acute illness or injury that poses threat of life of bodily function  Additional Data Reviewed and Analyzed Further history obtained from: None  Additional Factors Impacting ED Encounter Risk Use of parenteral controlled substances  Elmer Sow. Pilar Plate, MD [facility] Emergency Medicine [facility] [email or web address]  Final Clinical Impressions(s) / ED Diagnoses     ICD-10-CM   1. Nausea and vomiting, unspecified vomiting type  R11.2       ED Discharge Orders          Ordered    ondansetron (ZOFRAN-ODT) 4 MG disintegrating tablet  Every 8 hours PRN        09/28/21 0435             Discharge Instructions Discussed with and Provided to Patient:     Discharge Instructions      You were evaluated in the Emergency Department and after careful evaluation, we did not find any emergent condition requiring admission or further testing in the hospital.  Your exam/testing today was overall reassuring.  Recommend trying to avoid marijuana or THC products over the next 2 weeks to see if this helps her symptoms.  Can use the Zofran medication provided as needed for nausea.  Please return to the Emergency Department if you experience any worsening of your condition.  Thank you for allowing [place] to be a part of your care.        Sabas Sous, MD 09/28/21 [phone]

## 2021-09-28 NOTE — Discharge Instructions (Signed)
You were evaluated in the Emergency Department and after careful evaluation, we did not find any emergent condition requiring admission or further testing in the hospital.  Your exam/testing today was overall reassuring.  Recommend trying to avoid marijuana or THC products over the next 2 weeks to see if this helps her symptoms.  Can use the Zofran medication provided as needed for nausea.  Please return to the Emergency Department if you experience any worsening of your condition.  Thank you for allowing [place] to be a part of your care.

## 2022-02-17 ENCOUNTER — Emergency Department (HOSPITAL_COMMUNITY): Payer: Self-pay

## 2022-02-17 ENCOUNTER — Inpatient Hospital Stay (HOSPITAL_COMMUNITY)
Admission: EM | Admit: 2022-02-17 | Discharge: 2022-02-25 | DRG: 003 | Disposition: E | Payer: Self-pay | Attending: Pulmonary Disease | Admitting: Pulmonary Disease

## 2022-02-17 ENCOUNTER — Other Ambulatory Visit: Payer: Self-pay

## 2022-02-17 DIAGNOSIS — J9601 Acute respiratory failure with hypoxia: Secondary | ICD-10-CM

## 2022-02-17 DIAGNOSIS — R0489 Hemorrhage from other sites in respiratory passages: Secondary | ICD-10-CM

## 2022-02-17 DIAGNOSIS — R579 Shock, unspecified: Secondary | ICD-10-CM

## 2022-02-17 DIAGNOSIS — W3400XA Accidental discharge from unspecified firearms or gun, initial encounter: Principal | ICD-10-CM

## 2022-02-17 DIAGNOSIS — J80 Acute respiratory distress syndrome: Secondary | ICD-10-CM

## 2022-02-17 DIAGNOSIS — Z9911 Dependence on respirator [ventilator] status: Secondary | ICD-10-CM

## 2022-02-17 DIAGNOSIS — J9584 Transfusion-related acute lung injury (TRALI): Secondary | ICD-10-CM

## 2022-02-17 DIAGNOSIS — D62 Acute posthemorrhagic anemia: Secondary | ICD-10-CM

## 2022-02-17 DIAGNOSIS — D689 Coagulation defect, unspecified: Secondary | ICD-10-CM | POA: Diagnosis present

## 2022-02-17 DIAGNOSIS — S2232XA Fracture of one rib, left side, initial encounter for closed fracture: Secondary | ICD-10-CM | POA: Diagnosis present

## 2022-02-17 DIAGNOSIS — S272XXA Traumatic hemopneumothorax, initial encounter: Principal | ICD-10-CM | POA: Diagnosis present

## 2022-02-17 DIAGNOSIS — Z781 Physical restraint status: Secondary | ICD-10-CM

## 2022-02-17 DIAGNOSIS — D6959 Other secondary thrombocytopenia: Secondary | ICD-10-CM | POA: Diagnosis not present

## 2022-02-17 DIAGNOSIS — E870 Hyperosmolality and hypernatremia: Secondary | ICD-10-CM | POA: Diagnosis not present

## 2022-02-17 DIAGNOSIS — R821 Myoglobinuria: Secondary | ICD-10-CM | POA: Diagnosis not present

## 2022-02-17 DIAGNOSIS — R000 Tachycardia, unspecified: Secondary | ICD-10-CM | POA: Diagnosis not present

## 2022-02-17 DIAGNOSIS — R001 Bradycardia, unspecified: Secondary | ICD-10-CM | POA: Diagnosis present

## 2022-02-17 DIAGNOSIS — S27321A Contusion of lung, unilateral, initial encounter: Secondary | ICD-10-CM | POA: Diagnosis present

## 2022-02-17 DIAGNOSIS — E872 Acidosis, unspecified: Secondary | ICD-10-CM | POA: Diagnosis present

## 2022-02-17 DIAGNOSIS — I468 Cardiac arrest due to other underlying condition: Secondary | ICD-10-CM | POA: Diagnosis present

## 2022-02-17 DIAGNOSIS — R17 Unspecified jaundice: Secondary | ICD-10-CM | POA: Diagnosis present

## 2022-02-17 DIAGNOSIS — Y249XXA Unspecified firearm discharge, undetermined intent, initial encounter: Secondary | ICD-10-CM

## 2022-02-17 DIAGNOSIS — Z1152 Encounter for screening for COVID-19: Secondary | ICD-10-CM

## 2022-02-17 DIAGNOSIS — N170 Acute kidney failure with tubular necrosis: Secondary | ICD-10-CM | POA: Diagnosis present

## 2022-02-17 DIAGNOSIS — J45909 Unspecified asthma, uncomplicated: Secondary | ICD-10-CM | POA: Diagnosis present

## 2022-02-17 DIAGNOSIS — S2232XB Fracture of one rib, left side, initial encounter for open fracture: Secondary | ICD-10-CM | POA: Diagnosis present

## 2022-02-17 DIAGNOSIS — Z515 Encounter for palliative care: Secondary | ICD-10-CM

## 2022-02-17 DIAGNOSIS — G931 Anoxic brain damage, not elsewhere classified: Secondary | ICD-10-CM | POA: Diagnosis not present

## 2022-02-17 DIAGNOSIS — Z66 Do not resuscitate: Secondary | ICD-10-CM | POA: Diagnosis not present

## 2022-02-17 DIAGNOSIS — I6389 Other cerebral infarction: Secondary | ICD-10-CM | POA: Diagnosis not present

## 2022-02-17 DIAGNOSIS — S22059A Unspecified fracture of T5-T6 vertebra, initial encounter for closed fracture: Secondary | ICD-10-CM | POA: Diagnosis present

## 2022-02-17 DIAGNOSIS — G935 Compression of brain: Secondary | ICD-10-CM | POA: Diagnosis not present

## 2022-02-17 DIAGNOSIS — R7401 Elevation of levels of liver transaminase levels: Secondary | ICD-10-CM | POA: Diagnosis present

## 2022-02-17 DIAGNOSIS — M6282 Rhabdomyolysis: Secondary | ICD-10-CM | POA: Diagnosis not present

## 2022-02-17 DIAGNOSIS — T794XXA Traumatic shock, initial encounter: Secondary | ICD-10-CM | POA: Diagnosis present

## 2022-02-17 DIAGNOSIS — G919 Hydrocephalus, unspecified: Secondary | ICD-10-CM | POA: Diagnosis not present

## 2022-02-17 HISTORY — DX: Unspecified asthma, uncomplicated: J45.909

## 2022-02-17 LAB — I-STAT CHEM 8, ED
BUN: 7 mg/dL (ref 6–20)
Calcium, Ion: 0.72 mmol/L — CL (ref 1.15–1.40)
Chloride: 98 mmol/L (ref 98–111)
Creatinine, Ser: 1.1 mg/dL (ref 0.61–1.24)
Glucose, Bld: 307 mg/dL — ABNORMAL HIGH (ref 70–99)
HCT: 35 % — ABNORMAL LOW (ref 39.0–52.0)
Hemoglobin: 11.9 g/dL — ABNORMAL LOW (ref 13.0–17.0)
Potassium: 3.7 mmol/L (ref 3.5–5.1)
Sodium: 137 mmol/L (ref 135–145)
TCO2: 21 mmol/L — ABNORMAL LOW (ref 22–32)

## 2022-02-17 LAB — I-STAT ARTERIAL BLOOD GAS, ED
Acid-base deficit: 5 mmol/L — ABNORMAL HIGH (ref 0.0–2.0)
Bicarbonate: 23.9 mmol/L (ref 20.0–28.0)
Calcium, Ion: 0.83 mmol/L — CL (ref 1.15–1.40)
HCT: 38 % — ABNORMAL LOW (ref 39.0–52.0)
Hemoglobin: 12.9 g/dL — ABNORMAL LOW (ref 13.0–17.0)
O2 Saturation: 93 %
Patient temperature: 94.3
Potassium: 5.4 mmol/L — ABNORMAL HIGH (ref 3.5–5.1)
Sodium: 137 mmol/L (ref 135–145)
TCO2: 26 mmol/L (ref 22–32)
pCO2 arterial: 55.4 mmHg — ABNORMAL HIGH (ref 32–48)
pH, Arterial: 7.23 — ABNORMAL LOW (ref 7.35–7.45)
pO2, Arterial: 72 mmHg — ABNORMAL LOW (ref 83–108)

## 2022-02-17 LAB — URINALYSIS, ROUTINE W REFLEX MICROSCOPIC
Bilirubin Urine: NEGATIVE
Glucose, UA: 150 mg/dL — AB
Ketones, ur: NEGATIVE mg/dL
Leukocytes,Ua: NEGATIVE
Nitrite: NEGATIVE
Protein, ur: 100 mg/dL — AB
Specific Gravity, Urine: 1.014 (ref 1.005–1.030)
pH: 7 (ref 5.0–8.0)

## 2022-02-17 LAB — COMPREHENSIVE METABOLIC PANEL
ALT: 22 U/L (ref 0–44)
AST: 42 U/L — ABNORMAL HIGH (ref 15–41)
Albumin: 2.4 g/dL — ABNORMAL LOW (ref 3.5–5.0)
Alkaline Phosphatase: 40 U/L (ref 38–126)
Anion gap: 14 (ref 5–15)
BUN: 7 mg/dL (ref 6–20)
CO2: 21 mmol/L — ABNORMAL LOW (ref 22–32)
Calcium: 7.6 mg/dL — ABNORMAL LOW (ref 8.9–10.3)
Chloride: 102 mmol/L (ref 98–111)
Creatinine, Ser: 1.41 mg/dL — ABNORMAL HIGH (ref 0.61–1.24)
GFR, Estimated: >60 mL/min (ref >60)
Glucose, Bld: 314 mg/dL — ABNORMAL HIGH (ref 70–99)
Potassium: 4 mmol/L (ref 3.5–5.1)
Sodium: 137 mmol/L (ref 135–145)
Total Bilirubin: 0.4 mg/dL (ref 0.3–1.2)
Total Protein: 4 g/dL — ABNORMAL LOW (ref 6.5–8.1)

## 2022-02-17 LAB — TRAUMA TEG PANEL
CFF Max Amplitude: 11.7 mm — ABNORMAL LOW (ref 15–32)
Citrated Kaolin (R): 5 min (ref 4.6–9.1)
Citrated Rapid TEG (MA): <40 mm — ABNORMAL LOW (ref 52–70)
Lysis at 30 Minutes: 0.1 % (ref 0.0–2.6)

## 2022-02-17 LAB — PROTIME-INR
INR: 1.7 — ABNORMAL HIGH (ref 0.8–1.2)
Prothrombin Time: 19.5 seconds — ABNORMAL HIGH (ref 11.4–15.2)

## 2022-02-17 LAB — CBC
HCT: 37.4 % — ABNORMAL LOW (ref 39.0–52.0)
Hemoglobin: 12.5 g/dL — ABNORMAL LOW (ref 13.0–17.0)
MCH: 30.7 pg (ref 26.0–34.0)
MCHC: 33.4 g/dL (ref 30.0–36.0)
MCV: 91.9 fL (ref 80.0–100.0)
Platelets: 106 10*3/uL — ABNORMAL LOW (ref 150–400)
RBC: 4.07 MIL/uL — ABNORMAL LOW (ref 4.22–5.81)
RDW: 16.8 % — ABNORMAL HIGH (ref 11.5–15.5)
WBC: 12.7 10*3/uL — ABNORMAL HIGH (ref 4.0–10.5)
nRBC: 0 % (ref 0.0–0.2)

## 2022-02-17 LAB — SAMPLE TO BLOOD BANK

## 2022-02-17 LAB — ETHANOL: Alcohol, Ethyl (B): <10 mg/dL (ref <10)

## 2022-02-17 LAB — ABO/RH: ABO/RH(D): O POS

## 2022-02-17 LAB — MASSIVE TRANSFUSION PROTOCOL ORDER (BLOOD BANK NOTIFICATION)

## 2022-02-17 LAB — LACTIC ACID, PLASMA: Lactic Acid, Venous: 6.4 mmol/L (ref 0.5–1.9)

## 2022-02-17 MED ORDER — PANTOPRAZOLE SODIUM 40 MG PO TBEC
40.0000 mg | DELAYED_RELEASE_TABLET | Freq: Every day | ORAL | Status: DC
  Administered 2022-02-21: 40 mg via ORAL
  Filled 2022-02-17: qty 1

## 2022-02-17 MED ORDER — SODIUM CHLORIDE 0.9 % IV SOLN: INTRAVENOUS | Status: DC | PRN

## 2022-02-17 MED ORDER — LACTATED RINGERS IV SOLN: INTRAVENOUS | Status: DC

## 2022-02-17 MED ORDER — DOCUSATE SODIUM 50 MG/5ML PO LIQD
100.0000 mg | Freq: Two times a day (BID) | ORAL | Status: DC
  Administered 2022-02-19 – 2022-02-21 (×5): 100 mg
  Filled 2022-02-17 (×4): qty 10

## 2022-02-17 MED ORDER — ETOMIDATE 2 MG/ML IV SOLN
INTRAVENOUS | Status: AC | PRN
  Administered 2022-02-17: 20 mg via INTRAVENOUS

## 2022-02-17 MED ORDER — FENTANYL 2500MCG IN NS 250ML (10MCG/ML) PREMIX INFUSION
50.0000 ug/h | INTRAVENOUS | Status: DC
  Administered 2022-02-17: 100 ug/h via INTRAVENOUS
  Administered 2022-02-18: 150 ug/h via INTRAVENOUS
  Filled 2022-02-17 (×2): qty 250

## 2022-02-17 MED ORDER — MIDAZOLAM BOLUS VIA INFUSION
0.0000 mg | INTRAVENOUS | Status: DC | PRN
  Filled 2022-02-17: qty 5

## 2022-02-17 MED ORDER — MIDAZOLAM-SODIUM CHLORIDE 100-0.9 MG/100ML-% IV SOLN
0.0000 mg/h | INTRAVENOUS | Status: DC
  Administered 2022-02-17: 2 mg/h via INTRAVENOUS
  Administered 2022-02-19: 3 mg/h via INTRAVENOUS
  Filled 2022-02-17 (×2): qty 100

## 2022-02-17 MED ORDER — PANTOPRAZOLE SODIUM 40 MG IV SOLR
40.0000 mg | Freq: Every day | INTRAVENOUS | Status: DC
  Administered 2022-02-17 – 2022-02-22 (×4): 40 mg via INTRAVENOUS
  Filled 2022-02-17 (×4): qty 10

## 2022-02-17 MED ORDER — ORAL CARE MOUTH RINSE
15.0000 mL | OROMUCOSAL | Status: DC
  Administered 2022-02-17 – 2022-02-21 (×41): 15 mL via OROMUCOSAL

## 2022-02-17 MED ORDER — ONDANSETRON HCL 4 MG/2ML IJ SOLN: 4.0000 mg | Freq: Four times a day (QID) | INTRAMUSCULAR | Status: DC | PRN

## 2022-02-17 MED ORDER — NOREPINEPHRINE 16 MG/250ML-% IV SOLN
0.0000 ug/min | INTRAVENOUS | Status: DC
  Administered 2022-02-17 – 2022-02-18 (×2): 40 ug/min via INTRAVENOUS
  Administered 2022-02-22: 2 ug/min via INTRAVENOUS
  Filled 2022-02-17 (×5): qty 250

## 2022-02-17 MED ORDER — IOHEXOL 350 MG/ML SOLN
125.0000 mL | Freq: Once | INTRAVENOUS | Status: AC | PRN
  Administered 2022-02-17: 125 mL via INTRAVENOUS

## 2022-02-17 MED ORDER — ROCURONIUM BROMIDE 50 MG/5ML IV SOLN
INTRAVENOUS | Status: AC | PRN
  Administered 2022-02-17: 90 mg via INTRAVENOUS

## 2022-02-17 MED ORDER — PROPOFOL 1000 MG/100ML IV EMUL
5.0000 ug/kg/min | INTRAVENOUS | Status: DC
  Filled 2022-02-17: qty 100

## 2022-02-17 MED ORDER — POLYETHYLENE GLYCOL 3350 17 G PO PACK
17.0000 g | PACK | Freq: Every day | ORAL | Status: DC
  Administered 2022-02-20 – 2022-02-21 (×2): 17 g
  Filled 2022-02-17 (×2): qty 1

## 2022-02-17 MED ORDER — ORAL CARE MOUTH RINSE: 15.0000 mL | OROMUCOSAL | Status: DC | PRN

## 2022-02-17 MED ORDER — NOREPINEPHRINE 4 MG/250ML-% IV SOLN
0.0000 ug/min | INTRAVENOUS | Status: DC
  Administered 2022-02-17: 65 ug/min via INTRAVENOUS
  Administered 2022-02-17 (×2): 20 ug/min via INTRAVENOUS
  Filled 2022-02-17 (×2): qty 250

## 2022-02-17 MED ORDER — ONDANSETRON 4 MG PO TBDP: 4.0000 mg | ORAL_TABLET | Freq: Four times a day (QID) | ORAL | Status: DC | PRN

## 2022-02-17 MED ORDER — FENTANYL CITRATE PF 50 MCG/ML IJ SOSY
50.0000 ug | PREFILLED_SYRINGE | Freq: Once | INTRAMUSCULAR | Status: AC
  Administered 2022-02-21: 50 ug via INTRAVENOUS
  Filled 2022-02-17: qty 1

## 2022-02-17 MED ORDER — FENTANYL BOLUS VIA INFUSION
50.0000 ug | INTRAVENOUS | Status: DC | PRN
  Administered 2022-02-17 (×2): 100 ug via INTRAVENOUS
  Administered 2022-02-17: 50 ug via INTRAVENOUS
  Administered 2022-02-22: 100 ug via INTRAVENOUS
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
## 2022-02-17 NOTE — Progress Notes (Signed)
Pt transported to 4N19 without complications

## 2022-02-17 NOTE — ED Notes (Signed)
Right chest tube- 1000cc of blood  Left chest tube- 200 CC of blood

## 2022-02-17 NOTE — ED Notes (Signed)
PLEASE CONTACT DETECTIVE [facility] IF PTS STATUS CHANGES [phone]

## 2022-02-17 NOTE — Procedures (Signed)
Arterial Catheter Insertion Procedure Note  Dade Rodin  [id number]  07-Nov-2000  Date:02/17/22  Time:10:54 PM    Provider Performing: Hillis Range    Procedure: Insertion of Arterial Line (68032) without US guidance  Indication(s) Blood pressure monitoring and/or need for frequent ABGs  Consent Unable to obtain consent due to emergent nature of procedure.  Anesthesia None   Time Out Verified patient identification, verified procedure, site/side was marked, verified correct patient position, special equipment/implants available, medications/allergies/relevant history reviewed, required imaging and test results available.   Sterile Technique Maximal sterile technique including full sterile barrier drape, hand hygiene, sterile gown, sterile gloves, mask, hair covering, sterile ultrasound probe cover (if used).   Procedure Description Area of catheter insertion was cleaned with chlorhexidine and draped in sterile fashion. Without real-time ultrasound guidance an arterial catheter was placed into the right radial artery.  Appropriate arterial tracings confirmed on monitor.     Complications/Tolerance None; patient tolerated the procedure well.   EBL Minimal   Specimen(s) None
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
## 2022-02-17 NOTE — ED Notes (Signed)
Upon arrival to ED, pt arrived on NRB. Between transferring pt to bed from stretcher, pt lost pulses. CPR started at 1726. 1 round of CPR given. No ACLS given. Needle decompression by MD at 1735. Pt is 70 inches
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
## 2022-02-18 ENCOUNTER — Inpatient Hospital Stay (HOSPITAL_COMMUNITY): Payer: Self-pay | Admitting: Anesthesiology

## 2022-02-18 ENCOUNTER — Inpatient Hospital Stay (HOSPITAL_COMMUNITY): Payer: Self-pay

## 2022-02-18 ENCOUNTER — Encounter (HOSPITAL_COMMUNITY): Admission: EM | Disposition: E | Payer: Self-pay | Source: Home / Self Care | Attending: Pulmonary Disease

## 2022-02-18 ENCOUNTER — Encounter (HOSPITAL_COMMUNITY): Payer: Self-pay | Admitting: General Surgery

## 2022-02-18 DIAGNOSIS — W3400XA Accidental discharge from unspecified firearms or gun, initial encounter: Secondary | ICD-10-CM

## 2022-02-18 DIAGNOSIS — J90 Pleural effusion, not elsewhere classified: Secondary | ICD-10-CM

## 2022-02-18 DIAGNOSIS — S27392A Other injuries of lung, bilateral, initial encounter: Secondary | ICD-10-CM

## 2022-02-18 DIAGNOSIS — J9601 Acute respiratory failure with hypoxia: Secondary | ICD-10-CM

## 2022-02-18 DIAGNOSIS — J80 Acute respiratory distress syndrome: Secondary | ICD-10-CM

## 2022-02-18 DIAGNOSIS — D62 Acute posthemorrhagic anemia: Secondary | ICD-10-CM

## 2022-02-18 DIAGNOSIS — R031 Nonspecific low blood-pressure reading: Secondary | ICD-10-CM

## 2022-02-18 DIAGNOSIS — R579 Shock, unspecified: Secondary | ICD-10-CM

## 2022-02-18 DIAGNOSIS — Z9281 Personal history of extracorporeal membrane oxygenation (ECMO): Secondary | ICD-10-CM

## 2022-02-18 DIAGNOSIS — J45909 Unspecified asthma, uncomplicated: Secondary | ICD-10-CM

## 2022-02-18 DIAGNOSIS — S2193XA Puncture wound without foreign body of unspecified part of thorax, initial encounter: Secondary | ICD-10-CM

## 2022-02-18 DIAGNOSIS — S272XXA Traumatic hemopneumothorax, initial encounter: Secondary | ICD-10-CM

## 2022-02-18 DIAGNOSIS — S41039A Puncture wound without foreign body of unspecified shoulder, initial encounter: Secondary | ICD-10-CM

## 2022-02-18 HISTORY — PX: ECMO CANNULATION: CATH118321

## 2022-02-18 HISTORY — PX: CANNULATION FOR ECMO (EXTRACORPOREAL MEMBRANE OXYGENATION): SHX6796

## 2022-02-18 HISTORY — PX: CENTRAL LINE INSERTION: CATH118232

## 2022-02-18 HISTORY — PX: CENTRAL VENOUS CATHETER INSERTION: SHX401

## 2022-02-18 LAB — POCT I-STAT 7, (LYTES, BLD GAS, ICA,H+H)
Acid-Base Excess: 0 mmol/L (ref 0.0–2.0)
Acid-Base Excess: 0 mmol/L (ref 0.0–2.0)
Acid-Base Excess: 2 mmol/L (ref 0.0–2.0)
Acid-base deficit: 6 mmol/L — ABNORMAL HIGH (ref 0.0–2.0)
Acid-base deficit: 10 mmol/L — ABNORMAL HIGH (ref 0.0–2.0)
Acid-base deficit: 4 mmol/L — ABNORMAL HIGH (ref 0.0–2.0)
Acid-base deficit: 1 mmol/L (ref 0.0–2.0)
Acid-base deficit: 1 mmol/L (ref 0.0–2.0)
Acid-base deficit: 3 mmol/L — ABNORMAL HIGH (ref 0.0–2.0)
Acid-base deficit: 4 mmol/L — ABNORMAL HIGH (ref 0.0–2.0)
Bicarbonate: 22.9 mmol/L (ref 20.0–28.0)
Bicarbonate: 21.4 mmol/L (ref 20.0–28.0)
Bicarbonate: 28.4 mmol/L — ABNORMAL HIGH (ref 20.0–28.0)
Bicarbonate: 28.9 mmol/L — ABNORMAL HIGH (ref 20.0–28.0)
Bicarbonate: 26.1 mmol/L (ref 20.0–28.0)
Bicarbonate: 23.2 mmol/L (ref 20.0–28.0)
Bicarbonate: 19.7 mmol/L — ABNORMAL LOW (ref 20.0–28.0)
Bicarbonate: 23.5 mmol/L (ref 20.0–28.0)
Bicarbonate: 25.6 mmol/L (ref 20.0–28.0)
Bicarbonate: 28.7 mmol/L — ABNORMAL HIGH (ref 20.0–28.0)
Calcium, Ion: 0.82 mmol/L — CL (ref 1.15–1.40)
Calcium, Ion: 0.88 mmol/L — CL (ref 1.15–1.40)
Calcium, Ion: 0.87 mmol/L — CL (ref 1.15–1.40)
Calcium, Ion: 1.16 mmol/L (ref 1.15–1.40)
Calcium, Ion: 0.95 mmol/L — ABNORMAL LOW (ref 1.15–1.40)
Calcium, Ion: 1.16 mmol/L (ref 1.15–1.40)
Calcium, Ion: 1.05 mmol/L — ABNORMAL LOW (ref 1.15–1.40)
Calcium, Ion: 1.07 mmol/L — ABNORMAL LOW (ref 1.15–1.40)
Calcium, Ion: 1.1 mmol/L — ABNORMAL LOW (ref 1.15–1.40)
Calcium, Ion: 1.23 mmol/L (ref 1.15–1.40)
HCT: 44 % (ref 39.0–52.0)
HCT: 48 % (ref 39.0–52.0)
HCT: 47 % (ref 39.0–52.0)
HCT: 50 % (ref 39.0–52.0)
HCT: 35 % — ABNORMAL LOW (ref 39.0–52.0)
HCT: 38 % — ABNORMAL LOW (ref 39.0–52.0)
HCT: 44 % (ref 39.0–52.0)
HCT: 43 % (ref 39.0–52.0)
HCT: 44 % (ref 39.0–52.0)
HCT: 42 % (ref 39.0–52.0)
Hemoglobin: 15 g/dL (ref 13.0–17.0)
Hemoglobin: 16.3 g/dL (ref 13.0–17.0)
Hemoglobin: 16 g/dL (ref 13.0–17.0)
Hemoglobin: 17 g/dL (ref 13.0–17.0)
Hemoglobin: 11.9 g/dL — ABNORMAL LOW (ref 13.0–17.0)
Hemoglobin: 12.9 g/dL — ABNORMAL LOW (ref 13.0–17.0)
Hemoglobin: 15 g/dL (ref 13.0–17.0)
Hemoglobin: 14.6 g/dL (ref 13.0–17.0)
Hemoglobin: 15 g/dL (ref 13.0–17.0)
Hemoglobin: 14.3 g/dL (ref 13.0–17.0)
O2 Saturation: 100 %
O2 Saturation: 21 %
O2 Saturation: 13 %
O2 Saturation: 61 %
O2 Saturation: 100 %
O2 Saturation: 100 %
O2 Saturation: 100 %
O2 Saturation: 100 %
O2 Saturation: 100 %
O2 Saturation: 93 %
Patient temperature: 38.2
Patient temperature: 38.2
Patient temperature: 37.5
Patient temperature: 35.9
Patient temperature: 37
Patient temperature: 36.3
Patient temperature: 36.5
Patient temperature: 36.2
Patient temperature: 36
Potassium: 4.2 mmol/L (ref 3.5–5.1)
Potassium: 4.6 mmol/L (ref 3.5–5.1)
Potassium: 5 mmol/L (ref 3.5–5.1)
Potassium: 3.7 mmol/L (ref 3.5–5.1)
Potassium: 3.5 mmol/L (ref 3.5–5.1)
Potassium: 3.6 mmol/L (ref 3.5–5.1)
Potassium: 3.2 mmol/L — ABNORMAL LOW (ref 3.5–5.1)
Potassium: 2.9 mmol/L — ABNORMAL LOW (ref 3.5–5.1)
Potassium: 3.1 mmol/L — ABNORMAL LOW (ref 3.5–5.1)
Potassium: 3.3 mmol/L — ABNORMAL LOW (ref 3.5–5.1)
Sodium: 150 mmol/L — ABNORMAL HIGH (ref 135–145)
Sodium: 147 mmol/L — ABNORMAL HIGH (ref 135–145)
Sodium: 152 mmol/L — ABNORMAL HIGH (ref 135–145)
Sodium: 154 mmol/L — ABNORMAL HIGH (ref 135–145)
Sodium: 153 mmol/L — ABNORMAL HIGH (ref 135–145)
Sodium: 150 mmol/L — ABNORMAL HIGH (ref 135–145)
Sodium: 149 mmol/L — ABNORMAL HIGH (ref 135–145)
Sodium: 148 mmol/L — ABNORMAL HIGH (ref 135–145)
Sodium: 149 mmol/L — ABNORMAL HIGH (ref 135–145)
Sodium: 150 mmol/L — ABNORMAL HIGH (ref 135–145)
TCO2: 25 mmol/L (ref 22–32)
TCO2: 23 mmol/L (ref 22–32)
TCO2: 31 mmol/L (ref 22–32)
TCO2: 31 mmol/L (ref 22–32)
TCO2: 28 mmol/L (ref 22–32)
TCO2: 25 mmol/L (ref 22–32)
TCO2: 21 mmol/L — ABNORMAL LOW (ref 22–32)
TCO2: 25 mmol/L (ref 22–32)
TCO2: 27 mmol/L (ref 22–32)
TCO2: 30 mmol/L (ref 22–32)
pCO2 arterial: 62.2 mmHg — ABNORMAL HIGH (ref 32–48)
pCO2 arterial: 73.3 mmHg (ref 32–48)
pCO2 arterial: 90.3 mmHg (ref 32–48)
pCO2 arterial: 71.3 mmHg (ref 32–48)
pCO2 arterial: 49.2 mmHg — ABNORMAL HIGH (ref 32–48)
pCO2 arterial: 43.2 mmHg (ref 32–48)
pCO2 arterial: 30.7 mmHg — ABNORMAL LOW (ref 32–48)
pCO2 arterial: 34.6 mmHg (ref 32–48)
pCO2 arterial: 42.6 mmHg (ref 32–48)
pCO2 arterial: 51.1 mmHg — ABNORMAL HIGH (ref 32–48)
pH, Arterial: 7.18 — CL (ref 7.35–7.45)
pH, Arterial: 7.081 — CL (ref 7.35–7.45)
pH, Arterial: 7.108 — CL (ref 7.35–7.45)
pH, Arterial: 7.216 — ABNORMAL LOW (ref 7.35–7.45)
pH, Arterial: 7.327 — ABNORMAL LOW (ref 7.35–7.45)
pH, Arterial: 7.338 — ABNORMAL LOW (ref 7.35–7.45)
pH, Arterial: 7.412 (ref 7.35–7.45)
pH, Arterial: 7.438 (ref 7.35–7.45)
pH, Arterial: 7.383 (ref 7.35–7.45)
pH, Arterial: 7.353 (ref 7.35–7.45)
pO2, Arterial: 217 mmHg — ABNORMAL HIGH (ref 83–108)
pO2, Arterial: 23 mmHg — CL (ref 83–108)
pO2, Arterial: 17 mmHg — CL (ref 83–108)
pO2, Arterial: 39 mmHg — CL (ref 83–108)
pO2, Arterial: 347 mmHg — ABNORMAL HIGH (ref 83–108)
pO2, Arterial: 341 mmHg — ABNORMAL HIGH (ref 83–108)
pO2, Arterial: 368 mmHg — ABNORMAL HIGH (ref 83–108)
pO2, Arterial: 372 mmHg — ABNORMAL HIGH (ref 83–108)
pO2, Arterial: 354 mmHg — ABNORMAL HIGH (ref 83–108)
pO2, Arterial: 67 mmHg — ABNORMAL LOW (ref 83–108)

## 2022-02-18 LAB — PREPARE FRESH FROZEN PLASMA
Unit division: 0
Unit division: 0
Unit division: 0
Unit division: 0
Unit division: 0
Unit division: 0
Unit division: 0
Unit division: 0
Unit division: 0
Unit division: 0
Unit division: 0
Unit division: 0
Unit division: 0
Unit division: 0
Unit division: 0
Unit division: 0
Unit division: 0
Unit division: 0

## 2022-02-18 LAB — CBC
HCT: 46.3 % (ref 39.0–52.0)
HCT: 44.5 % (ref 39.0–52.0)
HCT: 45.5 % (ref 39.0–52.0)
Hemoglobin: 16.9 g/dL (ref 13.0–17.0)
Hemoglobin: 15.8 g/dL (ref 13.0–17.0)
Hemoglobin: 16.2 g/dL (ref 13.0–17.0)
MCH: 30.7 pg (ref 26.0–34.0)
MCH: 30.6 pg (ref 26.0–34.0)
MCH: 29.9 pg (ref 26.0–34.0)
MCHC: 36.5 g/dL — ABNORMAL HIGH (ref 30.0–36.0)
MCHC: 35.5 g/dL (ref 30.0–36.0)
MCHC: 35.6 g/dL (ref 30.0–36.0)
MCV: 84 fL (ref 80.0–100.0)
MCV: 86.1 fL (ref 80.0–100.0)
MCV: 83.9 fL (ref 80.0–100.0)
Platelets: 61 10*3/uL — ABNORMAL LOW (ref 150–400)
Platelets: 53 10*3/uL — ABNORMAL LOW (ref 150–400)
Platelets: 96 10*3/uL — ABNORMAL LOW (ref 150–400)
RBC: 5.51 MIL/uL (ref 4.22–5.81)
RBC: 5.17 MIL/uL (ref 4.22–5.81)
RBC: 5.42 MIL/uL (ref 4.22–5.81)
RDW: 15.5 % (ref 11.5–15.5)
RDW: 15.8 % — ABNORMAL HIGH (ref 11.5–15.5)
RDW: 15.5 % (ref 11.5–15.5)
WBC: 11.9 10*3/uL — ABNORMAL HIGH (ref 4.0–10.5)
WBC: 6.8 10*3/uL (ref 4.0–10.5)
WBC: 7.6 10*3/uL (ref 4.0–10.5)
nRBC: 0 % (ref 0.0–0.2)
nRBC: 0 % (ref 0.0–0.2)
nRBC: 0 % (ref 0.0–0.2)

## 2022-02-18 LAB — PREPARE RBC (CROSSMATCH)

## 2022-02-18 LAB — GLOBAL TEG PANEL
CFF Max Amplitude: 6.9 mm — ABNORMAL LOW (ref 15–32)
CFF Max Amplitude: 16 mm (ref 15–32)
CK with Heparinase (R): 6 min (ref 4.3–8.3)
CK with Heparinase (R): 3.7 min — ABNORMAL LOW (ref 4.3–8.3)
Citrated Functional Fibrinogen: 159.4 mg/dL — ABNORMAL LOW (ref 278–581)
Citrated Functional Fibrinogen: 292 mg/dL (ref 278–581)
Citrated Kaolin (K): 4.8 min — ABNORMAL HIGH (ref 0.8–2.1)
Citrated Kaolin (K): 1.8 min (ref 0.8–2.1)
Citrated Kaolin (MA): <40 mm — ABNORMAL LOW (ref 52–69)
Citrated Kaolin (MA): 52.2 mm (ref 52–69)
Citrated Kaolin (R): 6.2 min (ref 4.6–9.1)
Citrated Kaolin (R): 4.4 min — ABNORMAL LOW (ref 4.6–9.1)
Citrated Kaolin Angle: 54.8 deg — ABNORMAL LOW (ref 63–78)
Citrated Kaolin Angle: 71.3 deg (ref 63–78)
Citrated Rapid TEG (MA): <40 mm — ABNORMAL LOW (ref 52–70)
Citrated Rapid TEG (MA): 47.6 mm — ABNORMAL LOW (ref 52–70)

## 2022-02-18 LAB — BPAM FFP
Blood Product Expiration Date: 202312252359
Blood Product Expiration Date: 202312252359
Blood Product Expiration Date: 202312252359
Blood Product Expiration Date: 202312252359
Blood Product Expiration Date: 202312252359
Blood Product Expiration Date: 202312272359
Blood Product Expiration Date: 202312272359
Blood Product Expiration Date: 202312272359
Blood Product Expiration Date: 202312272359
Blood Product Expiration Date: 202312282359
Blood Product Expiration Date: 202312292359
Blood Product Expiration Date: 202312292359
Blood Product Expiration Date: 202312292359
Blood Product Expiration Date: 202312292359
Blood Product Expiration Date: 202401082359
Blood Product Expiration Date: 202401082359
Blood Product Expiration Date: 202401082359
Blood Product Expiration Date: 202401082359
Blood Product Expiration Date: 202401082359
Blood Product Expiration Date: 202401122359
ISSUE DATE / TIME: 202312241735
ISSUE DATE / TIME: 202312241735
ISSUE DATE / TIME: 202312241735
ISSUE DATE / TIME: 202312241735
ISSUE DATE / TIME: 202312241735
ISSUE DATE / TIME: 202312241735
ISSUE DATE / TIME: 202312241735
ISSUE DATE / TIME: 202312241735
ISSUE DATE / TIME: 202312241746
ISSUE DATE / TIME: 202312241746
ISSUE DATE / TIME: 202312241746
ISSUE DATE / TIME: 202312241746
ISSUE DATE / TIME: 202312242115
ISSUE DATE / TIME: 202312242115
ISSUE DATE / TIME: 202312242115
ISSUE DATE / TIME: 202312242115
ISSUE DATE / TIME: 202312242133
ISSUE DATE / TIME: 202312250407
ISSUE DATE / TIME: 202312250407
ISSUE DATE / TIME: 202312250419
Unit Type and Rh: 600
Unit Type and Rh: 6200
Unit Type and Rh: 6200
Unit Type and Rh: 6200
Unit Type and Rh: 6200
Unit Type and Rh: 6200
Unit Type and Rh: 6200
Unit Type and Rh: 6200
Unit Type and Rh: 6200
Unit Type and Rh: 6200
Unit Type and Rh: 6200
Unit Type and Rh: 6200
Unit Type and Rh: 6200
Unit Type and Rh: 6200
Unit Type and Rh: 6200
Unit Type and Rh: 6200
Unit Type and Rh: 6200
Unit Type and Rh: 6200
Unit Type and Rh: 6200
Unit Type and Rh: 6200

## 2022-02-18 LAB — BASIC METABOLIC PANEL
Anion gap: 5 (ref 5–15)
Anion gap: 13 (ref 5–15)
BUN: 7 mg/dL (ref 6–20)
BUN: 12 mg/dL (ref 6–20)
CO2: 25 mmol/L (ref 22–32)
CO2: 22 mmol/L (ref 22–32)
Calcium: 6.8 mg/dL — ABNORMAL LOW (ref 8.9–10.3)
Calcium: 7.9 mg/dL — ABNORMAL LOW (ref 8.9–10.3)
Chloride: 116 mmol/L — ABNORMAL HIGH (ref 98–111)
Chloride: 113 mmol/L — ABNORMAL HIGH (ref 98–111)
Creatinine, Ser: 1.11 mg/dL (ref 0.61–1.24)
Creatinine, Ser: 1.56 mg/dL — ABNORMAL HIGH (ref 0.61–1.24)
GFR, Estimated: >60 mL/min (ref >60)
GFR, Estimated: >60 mL/min (ref >60)
Glucose, Bld: 95 mg/dL (ref 70–99)
Glucose, Bld: 128 mg/dL — ABNORMAL HIGH (ref 70–99)
Potassium: 3.3 mmol/L — ABNORMAL LOW (ref 3.5–5.1)
Potassium: 2.8 mmol/L — ABNORMAL LOW (ref 3.5–5.1)
Sodium: 146 mmol/L — ABNORMAL HIGH (ref 135–145)
Sodium: 148 mmol/L — ABNORMAL HIGH (ref 135–145)

## 2022-02-18 LAB — DIC (DISSEMINATED INTRAVASCULAR COAGULATION)PANEL
D-Dimer, Quant: >20 ug/mL-FEU — ABNORMAL HIGH (ref 0.00–0.50)
D-Dimer, Quant: >20 ug/mL-FEU — ABNORMAL HIGH (ref 0.00–0.50)
Fibrinogen: 157 mg/dL — ABNORMAL LOW (ref 210–475)
Fibrinogen: 172 mg/dL — ABNORMAL LOW (ref 210–475)
INR: 1.5 — ABNORMAL HIGH (ref 0.8–1.2)
INR: 1.7 — ABNORMAL HIGH (ref 0.8–1.2)
Platelets: 62 10*3/uL — ABNORMAL LOW (ref 150–400)
Platelets: 45 10*3/uL — ABNORMAL LOW (ref 150–400)
Prothrombin Time: 17.8 seconds — ABNORMAL HIGH (ref 11.4–15.2)
Prothrombin Time: 19.9 seconds — ABNORMAL HIGH (ref 11.4–15.2)
Smear Review: NONE SEEN
Smear Review: NONE SEEN
aPTT: 35 seconds (ref 24–36)
aPTT: 44 seconds — ABNORMAL HIGH (ref 24–36)

## 2022-02-18 LAB — COMPREHENSIVE METABOLIC PANEL
ALT: 25 U/L (ref 0–44)
AST: 56 U/L — ABNORMAL HIGH (ref 15–41)
Albumin: 1.7 g/dL — ABNORMAL LOW (ref 3.5–5.0)
Alkaline Phosphatase: 34 U/L — ABNORMAL LOW (ref 38–126)
Anion gap: 7 (ref 5–15)
BUN: 8 mg/dL (ref 6–20)
CO2: 26 mmol/L (ref 22–32)
Calcium: 8.7 mg/dL — ABNORMAL LOW (ref 8.9–10.3)
Chloride: 119 mmol/L — ABNORMAL HIGH (ref 98–111)
Creatinine, Ser: 1.62 mg/dL — ABNORMAL HIGH (ref 0.61–1.24)
GFR, Estimated: >60 mL/min (ref >60)
Glucose, Bld: 72 mg/dL (ref 70–99)
Potassium: 3.5 mmol/L (ref 3.5–5.1)
Sodium: 152 mmol/L — ABNORMAL HIGH (ref 135–145)
Total Bilirubin: 1 mg/dL (ref 0.3–1.2)
Total Protein: 3.1 g/dL — ABNORMAL LOW (ref 6.5–8.1)

## 2022-02-18 LAB — PREPARE CRYOPRECIPITATE: Unit division: 0

## 2022-02-18 LAB — LACTIC ACID, PLASMA
Lactic Acid, Venous: 1.3 mmol/L (ref 0.5–1.9)
Lactic Acid, Venous: 4.7 mmol/L (ref 0.5–1.9)
Lactic Acid, Venous: 5 mmol/L (ref 0.5–1.9)
Lactic Acid, Venous: 5.2 mmol/L (ref 0.5–1.9)

## 2022-02-18 LAB — TRAUMA TEG PANEL
CFF Max Amplitude: <4 mm — ABNORMAL LOW (ref 15–32)
Citrated Kaolin (R): 6.7 min (ref 4.6–9.1)
Citrated Rapid TEG (MA): <40 mm — ABNORMAL LOW (ref 52–70)

## 2022-02-18 LAB — GLUCOSE, CAPILLARY
Glucose-Capillary: 179 mg/dL — ABNORMAL HIGH (ref 70–99)
Glucose-Capillary: 58 mg/dL — ABNORMAL LOW (ref 70–99)
Glucose-Capillary: 109 mg/dL — ABNORMAL HIGH (ref 70–99)
Glucose-Capillary: 125 mg/dL — ABNORMAL HIGH (ref 70–99)
Glucose-Capillary: 121 mg/dL — ABNORMAL HIGH (ref 70–99)
Glucose-Capillary: 118 mg/dL — ABNORMAL HIGH (ref 70–99)

## 2022-02-18 LAB — PREPARE PLATELET PHERESIS
Unit division: 0
Unit division: 0

## 2022-02-18 LAB — POCT ACTIVATED CLOTTING TIME: Activated Clotting Time: 158 seconds

## 2022-02-18 LAB — BPAM PLATELET PHERESIS
Blood Product Expiration Date: 202312252359
Blood Product Expiration Date: 202312262359
ISSUE DATE / TIME: 202312241737
ISSUE DATE / TIME: 202312250408
Unit Type and Rh: 5100
Unit Type and Rh: 5100

## 2022-02-18 LAB — BPAM CRYOPRECIPITATE
Blood Product Expiration Date: 202312242355
ISSUE DATE / TIME: 202312242119
Unit Type and Rh: 6200

## 2022-02-18 LAB — VANCOMYCIN, RANDOM: Vancomycin Rm: 17 ug/mL

## 2022-02-18 LAB — FIBRINOGEN: Fibrinogen: 168 mg/dL — ABNORMAL LOW (ref 210–475)

## 2022-02-18 LAB — MRSA NEXT GEN BY PCR, NASAL: MRSA by PCR Next Gen: NOT DETECTED

## 2022-02-18 LAB — LACTATE DEHYDROGENASE: LDH: 266 U/L — ABNORMAL HIGH (ref 98–192)

## 2022-02-18 SURGERY — Surgical Case
Anesthesia: *Unknown

## 2022-02-18 SURGERY — INSERTION, CATHETER, CENTRAL VENOUS, ADULT
Anesthesia: General | Site: Neck | Laterality: Right

## 2022-02-18 SURGERY — ECMO CANNULATION
Anesthesia: LOCAL

## 2022-02-18 MED ORDER — STERILE WATER FOR INJECTION IV SOLN
INTRAVENOUS | Status: DC
  Filled 2022-02-18: qty 150
  Filled 2022-02-18 (×2): qty 1000
  Filled 2022-02-18: qty 150

## 2022-02-18 MED ORDER — VANCOMYCIN HCL 1250 MG/250ML IV SOLN
1250.0000 mg | Freq: Once | INTRAVENOUS | Status: AC
  Administered 2022-02-18: 1250 mg via INTRAVENOUS
  Filled 2022-02-18: qty 250

## 2022-02-18 MED ORDER — SODIUM CHLORIDE 0.9% IV SOLUTION: Freq: Once | INTRAVENOUS | Status: AC

## 2022-02-18 MED ORDER — SODIUM CHLORIDE 0.9% IV SOLUTION: Freq: Once | INTRAVENOUS | Status: DC

## 2022-02-18 MED ORDER — HEPARIN (PORCINE) IN NACL 1000-0.9 UT/500ML-% IV SOLN
INTRAVENOUS | Status: DC | PRN
  Administered 2022-02-18: 500 mL

## 2022-02-18 MED ORDER — ARFORMOTEROL TARTRATE 15 MCG/2ML IN NEBU
15.0000 ug | INHALATION_SOLUTION | Freq: Two times a day (BID) | RESPIRATORY_TRACT | Status: DC
  Administered 2022-02-18 – 2022-02-22 (×8): 15 ug via RESPIRATORY_TRACT
  Filled 2022-02-18 (×8): qty 2

## 2022-02-18 MED ORDER — VASOPRESSIN 20 UNITS/100 ML INFUSION FOR SHOCK
0.0000 [IU]/min | INTRAVENOUS | Status: DC
  Administered 2022-02-18: .03 [IU]/min via INTRAVENOUS
  Administered 2022-02-18: 0.03 [IU]/min via INTRAVENOUS
  Administered 2022-02-19 – 2022-02-20 (×2): 0.02 [IU]/min via INTRAVENOUS
  Administered 2022-02-21: 0.005 [IU]/min via INTRAVENOUS
  Filled 2022-02-18 (×7): qty 100

## 2022-02-18 MED ORDER — SODIUM BICARBONATE 8.4 % IV SOLN
INTRAVENOUS | Status: AC
  Filled 2022-02-18: qty 100

## 2022-02-18 MED ORDER — EPINEPHRINE HCL 5 MG/250ML IV SOLN IN NS
0.5000 ug/min | Freq: Once | INTRAVENOUS | Status: DC
  Filled 2022-02-18: qty 250

## 2022-02-18 MED ORDER — MIDAZOLAM HCL 2 MG/2ML IJ SOLN
INTRAMUSCULAR | Status: DC | PRN
  Administered 2022-02-18: 4 mg via INTRAVENOUS

## 2022-02-18 MED ORDER — ROCURONIUM BROMIDE 10 MG/ML (PF) SYRINGE
PREFILLED_SYRINGE | INTRAVENOUS | Status: DC | PRN
  Administered 2022-02-18: 100 mg via INTRAVENOUS

## 2022-02-18 MED ORDER — SODIUM CHLORIDE 0.9 % IV SOLN: INTRAVENOUS | Status: DC

## 2022-02-18 MED ORDER — PHENYLEPHRINE HCL-NACL 20-0.9 MG/250ML-% IV SOLN
25.0000 ug/min | INTRAVENOUS | Status: DC
  Administered 2022-02-18: 150 ug/min via INTRAVENOUS
  Filled 2022-02-18 (×2): qty 250

## 2022-02-18 MED ORDER — INSULIN ASPART 100 UNIT/ML IJ SOLN
1.0000 [IU] | INTRAMUSCULAR | Status: DC
  Administered 2022-02-18 – 2022-02-21 (×6): 1 [IU] via SUBCUTANEOUS

## 2022-02-18 MED ORDER — SODIUM CHLORIDE 0.9 % IV SOLN: INTRAVENOUS | Status: DC | PRN

## 2022-02-18 MED ORDER — PHENYLEPHRINE HCL-NACL 20-0.9 MG/250ML-% IV SOLN
INTRAVENOUS | Status: AC
  Administered 2022-02-18: 20 mg
  Filled 2022-02-18: qty 250

## 2022-02-18 MED ORDER — CHLORHEXIDINE GLUCONATE CLOTH 2 % EX PADS
6.0000 | MEDICATED_PAD | Freq: Every day | CUTANEOUS | Status: DC
  Administered 2022-02-19 – 2022-02-22 (×5): 6 via TOPICAL

## 2022-02-18 MED ORDER — SODIUM CHLORIDE 0.9 % IV SOLN
4.0000 g | Freq: Once | INTRAVENOUS | Status: AC
  Administered 2022-02-18: 4 g via INTRAVENOUS
  Filled 2022-02-18: qty 40

## 2022-02-18 MED ORDER — DEXTROSE 50 % IV SOLN: 0.0000 mL | INTRAVENOUS | Status: DC | PRN

## 2022-02-18 MED ORDER — DEXTROSE 50 % IV SOLN
12.5000 g | INTRAVENOUS | Status: AC
  Administered 2022-02-18: 12.5 g via INTRAVENOUS
  Filled 2022-02-18: qty 50

## 2022-02-18 MED ORDER — ALBUMIN HUMAN 5 % IV SOLN
12.5000 g | INTRAVENOUS | Status: DC | PRN
  Administered 2022-02-18 – 2022-02-21 (×5): 12.5 g via INTRAVENOUS
  Filled 2022-02-18 (×3): qty 250

## 2022-02-18 MED ORDER — ARTIFICIAL TEARS OPHTHALMIC OINT
1.0000 | TOPICAL_OINTMENT | Freq: Three times a day (TID) | OPHTHALMIC | Status: DC
  Administered 2022-02-18 (×2): 1 via OPHTHALMIC
  Filled 2022-02-18: qty 3.5

## 2022-02-18 MED ORDER — DEXTROSE-NACL 5-0.45 % IV SOLN: INTRAVENOUS | Status: DC

## 2022-02-18 MED ORDER — BUDESONIDE 0.25 MG/2ML IN SUSP
0.2500 mg | Freq: Two times a day (BID) | RESPIRATORY_TRACT | Status: DC
  Administered 2022-02-18 – 2022-02-22 (×8): 0.25 mg via RESPIRATORY_TRACT
  Filled 2022-02-18 (×8): qty 2

## 2022-02-18 MED ORDER — VASOPRESSIN 20 UNIT/ML IV SOLN
INTRAVENOUS | Status: DC | PRN
  Administered 2022-02-18 (×4): 1 [IU] via INTRAVENOUS

## 2022-02-18 MED ORDER — ROCURONIUM BROMIDE 10 MG/ML (PF) SYRINGE
PREFILLED_SYRINGE | INTRAVENOUS | Status: AC
  Filled 2022-02-18: qty 10

## 2022-02-18 MED ORDER — SODIUM CHLORIDE 0.9 % IV SOLN
250.0000 mL | INTRAVENOUS | Status: DC
  Administered 2022-02-18 – 2022-02-20 (×2): 250 mL via INTRAVENOUS

## 2022-02-18 MED ORDER — CALCIUM GLUCONATE-NACL 2-0.675 GM/100ML-% IV SOLN
2.0000 g | Freq: Once | INTRAVENOUS | Status: AC
  Administered 2022-02-18: 2000 mg via INTRAVENOUS
  Filled 2022-02-18: qty 100

## 2022-02-18 MED ORDER — VANCOMYCIN HCL IN DEXTROSE 1-5 GM/200ML-% IV SOLN
1000.0000 mg | Freq: Two times a day (BID) | INTRAVENOUS | Status: DC
  Administered 2022-02-18 – 2022-02-21 (×7): 1000 mg via INTRAVENOUS
  Filled 2022-02-18 (×7): qty 200

## 2022-02-18 MED ORDER — VASOPRESSIN 20 UNITS/100 ML INFUSION FOR SHOCK
INTRAVENOUS | Status: AC
  Filled 2022-02-18: qty 100

## 2022-02-18 MED ORDER — SODIUM CHLORIDE 0.9 % IV SOLN
2.0000 g | Freq: Three times a day (TID) | INTRAVENOUS | Status: DC
  Administered 2022-02-18 – 2022-02-22 (×13): 2 g via INTRAVENOUS
  Filled 2022-02-18 (×15): qty 40

## 2022-02-18 MED ORDER — PHENYLEPHRINE HCL-NACL 20-0.9 MG/250ML-% IV SOLN
INTRAVENOUS | Status: AC
  Filled 2022-02-18: qty 500

## 2022-02-18 MED ORDER — MIDAZOLAM HCL 2 MG/2ML IJ SOLN
INTRAMUSCULAR | Status: AC
  Filled 2022-02-18: qty 4

## 2022-02-18 MED ORDER — SODIUM CHLORIDE 0.9 % IV SOLN
2.0000 g | Freq: Once | INTRAVENOUS | Status: AC
  Administered 2022-02-18: 2 g via INTRAVENOUS
  Filled 2022-02-18: qty 40

## 2022-02-18 MED ORDER — CALCIUM CHLORIDE 10 % IV SOLN
INTRAVENOUS | Status: DC | PRN
  Administered 2022-02-18 (×3): 1 g via INTRAVENOUS

## 2022-02-18 MED ORDER — TRANEXAMIC ACID-NACL 1000-0.7 MG/100ML-% IV SOLN
1000.0000 mg | Freq: Once | INTRAVENOUS | Status: AC
  Administered 2022-02-18: 1000 mg via INTRAVENOUS
  Filled 2022-02-18: qty 100

## 2022-02-18 MED ORDER — SODIUM CHLORIDE 0.9 % IV SOLN
20.0000 ug | Freq: Once | INTRAVENOUS | Status: AC
  Administered 2022-02-18: 20 ug via INTRAVENOUS
  Filled 2022-02-18: qty 5

## 2022-02-18 MED ORDER — VANCOMYCIN HCL IN DEXTROSE 1-5 GM/200ML-% IV SOLN: 1000.0000 mg | Freq: Three times a day (TID) | INTRAVENOUS | Status: DC

## 2022-02-18 MED ORDER — POTASSIUM CHLORIDE 10 MEQ/50ML IV SOLN
10.0000 meq | INTRAVENOUS | Status: AC
  Administered 2022-02-18 – 2022-02-19 (×6): 10 meq via INTRAVENOUS
  Filled 2022-02-18 (×2): qty 50

## 2022-02-18 MED ORDER — LIDOCAINE HCL (PF) 1 % IJ SOLN
INTRAMUSCULAR | Status: DC | PRN
  Administered 2022-02-18: 2 mL

## 2022-02-18 MED ORDER — SODIUM BICARBONATE 8.4 % IV SOLN
INTRAVENOUS | Status: AC
  Filled 2022-02-18: qty 50

## 2022-02-18 MED ORDER — VECURONIUM BROMIDE 10 MG IV SOLR: 0.1000 mg/kg | INTRAVENOUS | Status: DC | PRN

## 2022-02-18 MED ORDER — REVEFENACIN 175 MCG/3ML IN SOLN
175.0000 ug | Freq: Every day | RESPIRATORY_TRACT | Status: DC
  Administered 2022-02-19 – 2022-02-22 (×4): 175 ug via RESPIRATORY_TRACT
  Filled 2022-02-18 (×4): qty 3

## 2022-02-18 MED ORDER — LACTATED RINGERS IV BOLUS
1000.0000 mL | Freq: Once | INTRAVENOUS | Status: AC
  Administered 2022-02-18: 1000 mL via INTRAVENOUS

## 2022-02-18 MED ORDER — ROCURONIUM BROMIDE 10 MG/ML (PF) SYRINGE
PREFILLED_SYRINGE | INTRAVENOUS | Status: AC
  Administered 2022-02-18: 100 mg
  Filled 2022-02-18: qty 10

## 2022-02-18 MED ORDER — EPINEPHRINE HCL 5 MG/250ML IV SOLN IN NS: 0.5000 ug/min | INTRAVENOUS | Status: DC

## 2022-02-18 MED ORDER — FENTANYL CITRATE (PF) 2500 MCG/50ML IJ SOLN
0.0000 ug/h | Status: DC
  Administered 2022-02-18: 150 ug/h via INTRAVENOUS
  Administered 2022-02-19: 175 ug/h via INTRAVENOUS
  Filled 2022-02-18 (×3): qty 100

## 2022-02-18 MED ORDER — PANTOPRAZOLE SODIUM 40 MG IV SOLR: 40.0000 mg | Freq: Every day | INTRAVENOUS | Status: DC

## 2022-02-18 MED ORDER — LACTATED RINGERS IV BOLUS: 1000.0000 mL | Freq: Once | INTRAVENOUS | Status: DC

## 2022-02-18 MED ORDER — INSULIN REGULAR(HUMAN) IN NACL 100-0.9 UT/100ML-% IV SOLN: INTRAVENOUS | Status: DC

## 2022-02-18 SURGICAL SUPPLY — 8 items
BLANKET WARM CARDIAC ADLT BAIR (MISCELLANEOUS) ×1 IMPLANT
GUIDEWIRE SAFE TJ AMPLATZ EXST (WIRE) ×1 IMPLANT
KIT CATH LUMEN 3 7FR 30CM STRL (SET/KITS/TRAYS/PACK) ×1 IMPLANT
KIT HEART LEFT (KITS) ×1 IMPLANT
MAT PREVALON FULL STRYKER (MISCELLANEOUS) ×1 IMPLANT
SET TUBING HLS ADVANCED 7.0 (TUBING) ×1 IMPLANT
SHEATH PINNACLE 6F 10CM (SHEATH) ×1 IMPLANT
SHEATH PROBE COVER 6X72 (BAG) ×1 IMPLANT

## 2022-02-18 SURGICAL SUPPLY — 8 items
CATH DUAL LUMEN CRESCENT 32FR (CATHETERS) ×2
KIT HEART LEFT (KITS) ×2
KIT MICROPUNCTURE NIT STIFF (SHEATH) ×2
KIT VASCULAR ACCESS OPUS (MISCELLANEOUS) ×2
PACK CARDIAC CATHETERIZATION (CUSTOM PROCEDURE TRAY) ×2
SHEATH PINNACLE 6F 10CM (SHEATH) ×2
SHEATH PROBE COVER 6X72 (BAG) ×2
WIRE AMPLATZ SSTIFF .035X260CM (WIRE) ×2

## 2022-02-18 NOTE — Progress Notes (Addendum)
Vent settings changed to PC per Dr. [name] post ECMO start.
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
## 2022-02-18 NOTE — Progress Notes (Signed)
   02/18/22 0810  Clinical Encounter Type  Visited With Family;Patient not available  Visit Type Follow-up;Other (Comment) (STEMI)  Referral From Chaplain  Consult/Referral To Chaplain   Chaplain responded to a code STEMI. I provided support to the family of the patient as the medical team prepared to move the patient to the cath lab.    Valerie Roys [facility] [phone]
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
## 2022-02-18 NOTE — Progress Notes (Signed)
Dr Milas Kocher at bedised. 4 prbc, 4 FFP in cooler on bedside
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
## 2022-02-18 NOTE — Progress Notes (Signed)
Discussed with Dr. Karleen Hampshire and Dr. Delia Chimes Cardiothoracic surgery.  Difficult situation.  CO2 has been rising, some ventilator improvement after bronchoscopy but then declines.  Have not been able to ventilate.  Oxygenation becoming more difficult.  ECMO not considered an option due to recent hemorrhage with 3L blood loss from chest - would not be able to anticoagulate.  Does not seems stable enough for an operation, or one lung ventilation.  Decided to place another left sided chest tube to optimize chest drainage at the bedside.  I placed a left sided 40 Fr chest tube. At the bedside in 4H19, the patient's left chest was prepped and draped in sterile fashion.  A skin incision was made in the left flank, level of nipple, anterior axillary line.  A kelly clamp was introduced into the chest and spread to separate the intercostal muscles.  A 40 french chest tube was inserted into the chest and placed to pleur-evac 20 cm h20 suction.  It was directed posteriorly and superiorly.  It was secured to the skin with 0- silk suture.    Post chest tube x-ray demonstrated continued plugging of the right lung.  I decided to perform a repeat bronchoscopy.    The flexible bronchoscope was inserted into the airway via the ET tube.  Thick blood was suctioned from the trachea, right and left mainstem bronchus.  I then worked down the right mainstem bronchus to suction out the right upper lobe, worked down the bronchus intermedius and suctioned out the middle and lower lobe.  Then the left mainstem bronchus was intubated and the left upper and lower lobes suctioned.  After the thick blood was emptied from the central airways, thinner, frothier blood continued to bubble out of the more distal airways.  There was significant improvement in blood pressure, but marginal improvement in oxygenation with sats in the 70s after repeat bronchoscopy.  Quentin Ore, MD General, Bariatric and Minimally Invasive  Surgery [facility]
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
## 2022-02-18 NOTE — Progress Notes (Signed)
RT assisted MD at bedside for repeat bronchoscopy.

## 2022-02-18 NOTE — Progress Notes (Signed)
Patient intubated and sedated

## 2022-02-18 NOTE — Progress Notes (Signed)
Patient transported on vent from Cath LAb to 2H23 without complications.

## 2022-02-18 NOTE — CV Procedure (Signed)
   Intra-procedure limited TEE performed with assistance of Dr. Jean Rosenthal to confirm pre-op LV/RV function and guide ECMO cannula placement post cannulation.   Pre procedure: LV EF 60-65% RV mildly dilated with mildly reduced function  Post-procedure: ECMO cannula well positioned with good flow into RA  Arvilla Meres, MD  11:40 AM
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
## 2022-02-18 NOTE — Progress Notes (Signed)
  Echocardiogram 2D Echocardiogram has been performed.  Steven Adams M 02/18/2022, 12:47 PM
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
## 2022-02-18 NOTE — Progress Notes (Signed)
RT assisted MD at bedside with bronchoscopy.

## 2022-02-18 NOTE — Progress Notes (Signed)
VO to start fent gtt, give 100mg  roc.

## 2022-02-18 NOTE — Progress Notes (Signed)
Add'nal 100 roc and 4 mg versed to accompany pt to cath lab.

## 2022-02-18 NOTE — Progress Notes (Signed)
Trauma Event Note    Reason for Call : Hypotension and tachycardia  Event Summary: 0145: TRN called by primary RN. Patient hypotensive in 60s, tachycardic in 170s. Minimal output from chest tubes since 0000. TRN notified Trauma MD Stechschulte. Verbal order for 2 units PRBCs received.   0216: 2 units PRBCs administered. Patient blood pressure into 140s. HR back down into 130s. Trauma MD Stechschulte updated.  68: TRN notified by Primary RN that lung sounds now changed, and that blood was suctioned from ETT. Trauma MD Stechschulte notified. CXR ordered.   0335: TRN notified pt BP down trending again and at max levophed dose. Trauma MD Stechschulte notified. Orders received for TEG panel to be drawn, administration of FFP, platelets, and cryo. Also respiratory to try and suction again due to possible mucus plugging seen on CXR.  MD Notified: Stechschulte   Last imported Vital Signs BP 104/71   Pulse (!) 152   Temp (!) 100.8 F (38.2 C)   Resp (!) 22   Ht 5\' 10"  (1.778 m)   Wt 63.5 kg   SpO2 100%   BMI 20.09 kg/m   Trending CBC Recent Labs    02/17/22 1732 02/17/22 1840 02/17/22 2003 02/18/22 0150 02/18/22 0209  WBC 12.7*  --   --  11.9*  --   HGB 12.5*   < > 12.9* 16.9 15.0  HCT 37.4*   < > 38.0* 46.3 44.0  PLT 106*  --   --  61*  62*  --    < > = values in this interval not displayed.    Trending Coag's Recent Labs    02/17/22 1732 02/18/22 0150  APTT  --  PENDING  INR 1.7* PENDING    Trending BMET Recent Labs    02/17/22 1732 02/17/22 1840 02/17/22 2003 02/18/22 0150 02/18/22 0209  NA 137 137 137 146* 150*  K 4.0 3.7 5.4* 3.3* 4.2  CL 102 98  --  116*  --   CO2 21*  --   --  25  --   BUN 7 7  --  7  --   CREATININE 1.41* 1.10  --  1.11  --   GLUCOSE 314* 307*  --  95  --       02/19/2022  Trauma Response RN  Please call TRN at [phone] for further assistance.

## 2022-02-18 NOTE — Progress Notes (Signed)
Wasted if Fentanyl in steri cycle with Meryl Dare, RN.  Reynold Bowen, RN BSN 02/18/2022 9:07 AM

## 2022-02-18 NOTE — Progress Notes (Signed)
Evening labs reviewed: LA still rising CBC improved TEG still pending  Plan: Decrease bicarb to 100cc/h Drop sweep to 3.5L  Keep vent the same. 2g calcium gluconate   Steffanie Dunn, DO 02/18/22 5:54 PM [facility]  For contact information, see Amion. If no response to pager, please call PCCM consult pager. After hours, 7PM- 7AM, please call Elink.

## 2022-02-18 NOTE — Progress Notes (Signed)
Platelets hung with RRN.

## 2022-02-18 NOTE — Progress Notes (Signed)
Patient hypotensive with MAP in the 30s, acidotic pCO2 60s, levo up to 40, CXR with whiteout of the right lung and mediastinal shift to the right consistent with plugging.  I decided to do a bronchoscopy to try to clear the airway.  Respiratory therapy came to the bedside to assist.   At the bedside in 4N19, a timeout was completed. The flexible bronchoscope was inserted through the ET tube into the airway.  Blood and clots were encountered throughout the airway.  First, the left mainstem bronchus was intubated and I suctioned out the left upper lobe and left lower lobe.  No major clots encountered, but blood filled the airway.  The right mainstem bronchus was then intubated.  I suctioned out multiple thick clots.  Blood was suctioned from the right upper lobe.  The bronchus intermedius was intubated and blood was suctioned from the middle lobe and lower lobe airways.  The bronchoscope was removed.  The ET tube appeared in appropriate position at the conclusion of the procedure.  The patient's hemodynamics remained the same throughout the procedure.  Blood continues to accumulate in the airway and will need aggressive suctioning.    Quentin Ore, MD General, Bariatric and Minimally Invasive Surgery [facility]

## 2022-02-18 NOTE — Progress Notes (Signed)
HME and vent circuit changed out due to blood inside inspiratory and expiratory limbs. Pt tolerated well. No complications.
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
## 2022-02-18 NOTE — Procedures (Signed)
Arterial Catheter Insertion Procedure Note  Law Corsino  [id number]  17-Jul-2000  Date:02/18/22  Time:12:18 PM    Provider Performing: Steffanie Dunn    Procedure: Insertion of Arterial Line (81157) with US guidance (26203)   Indication(s) Blood pressure monitoring and/or need for frequent ABGs  Consent Unable to obtain consent due to emergent nature of procedure.  Anesthesia None   Time Out Verified patient identification, verified procedure, site/side was marked, verified correct patient position, special equipment/implants available, medications/allergies/relevant history reviewed, required imaging and test results available.   Sterile Technique Maximal sterile technique including full sterile barrier drape, hand hygiene, sterile gown, sterile gloves, mask, hair covering, sterile ultrasound probe cover (if used).   Procedure Description Area of catheter insertion was cleaned with chlorhexidine and draped in sterile fashion. With real-time ultrasound guidance an arterial catheter was placed into the left femoral artery.  Appropriate arterial tracings confirmed on monitor.   Injected Tracy tissue proximally with lidocaine, Aline placed with 1 stick, no dilation or incision. Confirmed appropriate waveform on monitor immediately after placement.   Complications/Tolerance None; patient tolerated the procedure well.   EBL Minimal   Specimen(s) None  Steffanie Dunn, DO 02/18/22 12:19 PM [facility]  For contact information, see Amion. If no response to pager, please call PCCM consult pager. After hours, 7PM- 7AM, please call Elink.
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
## 2022-02-18 NOTE — Progress Notes (Signed)
Perfusion arrived at 0801 to cath lab 6 for VV ECMO deployment. ECMO initiated @ [phone]. Transferred to 5E52 @ 1115.

## 2022-02-18 NOTE — Progress Notes (Signed)
Pt transported on vent from 4N19 to Cath Lab without complications.

## 2022-02-18 NOTE — Progress Notes (Signed)
Pt taken to cath lab by RN RRN RT and ECMO co-ordinator. Bedside handoff with Presenter, broadcasting.  Concentrated fentanyl infusing. Versed gtt left with pt but not infusing. 4mg  versed push at bedside, 100 mg Rocuronium at bedside.

## 2022-02-18 NOTE — Progress Notes (Addendum)
Follow up visit with family of patient. Mother and close family friend were bedside. Patient being care for by staff. Mother shared there is a greater improvement since the beginning, which leaves her hopeful. She will go home to rest and care for her younger son. Chaplain staff will follow up for support.

## 2022-02-18 NOTE — Consult Note (Signed)
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  Steffanie Dunn, DO 02/18/22 7:56 AM [facility]  For contact information, see Amion. If no response to pager, please call PCCM consult pager. After hours, 7PM- 7AM, please call Elink.   Steffanie Dunn Date: 02/18/2022 Time: 7:52 AM

## 2022-02-18 NOTE — Anesthesia Postprocedure Evaluation (Signed)
Anesthesia Post Note  Patient: Nadim Malia  Procedure(s) Performed: INSERTION CENTRAL LINE ADULT CANNULATION FOR ECMO (EXTRACORPOREAL MEMBRANE OXYGENATION) (Right: Neck)     Patient location during evaluation: SICU Anesthesia Type: General Level of consciousness: sedated and patient remains intubated per anesthesia plan Pain management: pain level controlled Vital Signs Assessment: post-procedure vital signs reviewed and stable Respiratory status: patient remains intubated per anesthesia plan, patient on ventilator - see flowsheet for VS and respiratory function unstable (requiring ECMO support) Cardiovascular status: stable Postop Assessment: no apparent nausea or vomiting Anesthetic complications: no Comments: ICU team continuing resuscitation and correction of coagulopathy, much more stable on ECMO support   There were no known notable events for this encounter.  Last Vitals:  Vitals:   02/18/22 1630 02/18/22 1645  BP:    Pulse: 98 93  Resp: 10 10  Temp: 36.5 C 36.5 C  SpO2: 100% 100%    Last Pain:  Vitals:   02/18/22 1600  TempSrc: Bladder  PainSc:                  Sudais Banghart,E. Kadia Abaya

## 2022-02-18 NOTE — Progress Notes (Signed)
PIV consult for vasopressor: Pt has central line. Has adequate access per MD. Cancel consult.

## 2022-02-18 NOTE — Transfer of Care (Signed)
Immediate Anesthesia Transfer of Care Note  Patient: Steven Adams  Procedure(s) Performed: INSERTION CENTRAL LINE ADULT CANNULATION FOR ECMO (EXTRACORPOREAL MEMBRANE OXYGENATION) (Right: Neck)  Patient Location: ICU  Anesthesia Type:General  Level of Consciousness: Patient remains intubated per anesthesia plan  Airway & Oxygen Therapy: Patient remains intubated per anesthesia plan and Patient placed on Ventilator (see vital sign flow sheet for setting)  Post-op Assessment: Report given to RN and Post -op Vital signs reviewed and stable  Post vital signs: Reviewed Pt remains on ECMO, Levo, Phenylephrine, vasopressin, Bicarb, Versed, and Fentanyl gtts. In addition pt is receiving 1 unit PRBC during transfer. SpO2 monitor not picking up in ICU, obtaining ABG.   Last Vitals:  Vitals Value Taken Time  BP 105/93 02/18/22 1128  Temp    Pulse 104 02/18/22 1128  Resp 10 02/18/22 1128  SpO2    Vitals shown include unvalidated device data.  Last Pain:  Vitals:   02/18/22 0916  TempSrc:   PainSc: 0-No pain         Complications: There were no known notable events for this encounter.
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
## 2022-02-18 NOTE — ED Notes (Signed)
Detective Gerome Apley requested to be called with status changes. [phone]

## 2022-02-18 NOTE — Progress Notes (Signed)
Report called to [facility] on 2 H.

## 2022-02-18 NOTE — Progress Notes (Signed)
Peak pressures remain 40 and over after bronch.  Breathing over the vent.  Will start paralytic to help with ventilation.  Quentin Ore, MD General, Bariatric and Minimally Invasive Surgery [facility]

## 2022-02-19 ENCOUNTER — Encounter (HOSPITAL_COMMUNITY): Payer: Self-pay | Admitting: Internal Medicine

## 2022-02-19 ENCOUNTER — Inpatient Hospital Stay (HOSPITAL_COMMUNITY): Payer: Self-pay

## 2022-02-19 DIAGNOSIS — Z515 Encounter for palliative care: Secondary | ICD-10-CM

## 2022-02-19 DIAGNOSIS — R0489 Hemorrhage from other sites in respiratory passages: Secondary | ICD-10-CM

## 2022-02-19 DIAGNOSIS — D62 Acute posthemorrhagic anemia: Secondary | ICD-10-CM

## 2022-02-19 DIAGNOSIS — Z9911 Dependence on respirator [ventilator] status: Secondary | ICD-10-CM

## 2022-02-19 DIAGNOSIS — R579 Shock, unspecified: Secondary | ICD-10-CM

## 2022-02-19 DIAGNOSIS — J9602 Acute respiratory failure with hypercapnia: Secondary | ICD-10-CM

## 2022-02-19 DIAGNOSIS — J80 Acute respiratory distress syndrome: Secondary | ICD-10-CM

## 2022-02-19 DIAGNOSIS — J9601 Acute respiratory failure with hypoxia: Secondary | ICD-10-CM

## 2022-02-19 DIAGNOSIS — J9584 Transfusion-related acute lung injury (TRALI): Secondary | ICD-10-CM

## 2022-02-19 DIAGNOSIS — W3400XA Accidental discharge from unspecified firearms or gun, initial encounter: Secondary | ICD-10-CM

## 2022-02-19 DIAGNOSIS — R4589 Other symptoms and signs involving emotional state: Secondary | ICD-10-CM

## 2022-02-19 DIAGNOSIS — Z9281 Personal history of extracorporeal membrane oxygenation (ECMO): Secondary | ICD-10-CM

## 2022-02-19 DIAGNOSIS — S27392A Other injuries of lung, bilateral, initial encounter: Secondary | ICD-10-CM

## 2022-02-19 LAB — PREPARE CRYOPRECIPITATE
Unit division: 0
Unit division: 0

## 2022-02-19 LAB — POCT I-STAT 7, (LYTES, BLD GAS, ICA,H+H)
Acid-Base Excess: 3 mmol/L — ABNORMAL HIGH (ref 0.0–2.0)
Acid-Base Excess: 3 mmol/L — ABNORMAL HIGH (ref 0.0–2.0)
Acid-Base Excess: 4 mmol/L — ABNORMAL HIGH (ref 0.0–2.0)
Acid-Base Excess: 4 mmol/L — ABNORMAL HIGH (ref 0.0–2.0)
Acid-Base Excess: 5 mmol/L — ABNORMAL HIGH (ref 0.0–2.0)
Acid-Base Excess: 5 mmol/L — ABNORMAL HIGH (ref 0.0–2.0)
Acid-Base Excess: 5 mmol/L — ABNORMAL HIGH (ref 0.0–2.0)
Acid-Base Excess: 5 mmol/L — ABNORMAL HIGH (ref 0.0–2.0)
Acid-Base Excess: 6 mmol/L — ABNORMAL HIGH (ref 0.0–2.0)
Bicarbonate: 27.8 mmol/L (ref 20.0–28.0)
Bicarbonate: 28.5 mmol/L — ABNORMAL HIGH (ref 20.0–28.0)
Bicarbonate: 28.8 mmol/L — ABNORMAL HIGH (ref 20.0–28.0)
Bicarbonate: 28.9 mmol/L — ABNORMAL HIGH (ref 20.0–28.0)
Bicarbonate: 28.9 mmol/L — ABNORMAL HIGH (ref 20.0–28.0)
Bicarbonate: 29.4 mmol/L — ABNORMAL HIGH (ref 20.0–28.0)
Bicarbonate: 30 mmol/L — ABNORMAL HIGH (ref 20.0–28.0)
Bicarbonate: 30.2 mmol/L — ABNORMAL HIGH (ref 20.0–28.0)
Bicarbonate: 30.7 mmol/L — ABNORMAL HIGH (ref 20.0–28.0)
Calcium, Ion: 1.13 mmol/L — ABNORMAL LOW (ref 1.15–1.40)
Calcium, Ion: 1.13 mmol/L — ABNORMAL LOW (ref 1.15–1.40)
Calcium, Ion: 1.14 mmol/L — ABNORMAL LOW (ref 1.15–1.40)
Calcium, Ion: 1.17 mmol/L (ref 1.15–1.40)
Calcium, Ion: 1.17 mmol/L (ref 1.15–1.40)
Calcium, Ion: 1.17 mmol/L (ref 1.15–1.40)
Calcium, Ion: 1.18 mmol/L (ref 1.15–1.40)
Calcium, Ion: 1.21 mmol/L (ref 1.15–1.40)
Calcium, Ion: 1.22 mmol/L (ref 1.15–1.40)
HCT: 34 % — ABNORMAL LOW (ref 39.0–52.0)
HCT: 35 % — ABNORMAL LOW (ref 39.0–52.0)
HCT: 35 % — ABNORMAL LOW (ref 39.0–52.0)
HCT: 36 % — ABNORMAL LOW (ref 39.0–52.0)
HCT: 36 % — ABNORMAL LOW (ref 39.0–52.0)
HCT: 37 % — ABNORMAL LOW (ref 39.0–52.0)
HCT: 38 % — ABNORMAL LOW (ref 39.0–52.0)
HCT: 39 % (ref 39.0–52.0)
HCT: 40 % (ref 39.0–52.0)
Hemoglobin: 11.6 g/dL — ABNORMAL LOW (ref 13.0–17.0)
Hemoglobin: 11.9 g/dL — ABNORMAL LOW (ref 13.0–17.0)
Hemoglobin: 11.9 g/dL — ABNORMAL LOW (ref 13.0–17.0)
Hemoglobin: 12.2 g/dL — ABNORMAL LOW (ref 13.0–17.0)
Hemoglobin: 12.2 g/dL — ABNORMAL LOW (ref 13.0–17.0)
Hemoglobin: 12.6 g/dL — ABNORMAL LOW (ref 13.0–17.0)
Hemoglobin: 12.9 g/dL — ABNORMAL LOW (ref 13.0–17.0)
Hemoglobin: 13.3 g/dL (ref 13.0–17.0)
Hemoglobin: 13.6 g/dL (ref 13.0–17.0)
O2 Saturation: 100 %
O2 Saturation: 100 %
O2 Saturation: 100 %
O2 Saturation: 100 %
O2 Saturation: 100 %
O2 Saturation: 97 %
O2 Saturation: 98 %
O2 Saturation: 98 %
O2 Saturation: 99 %
Patient temperature: 35.9
Patient temperature: 36.1
Patient temperature: 36.3
Patient temperature: 36.3
Patient temperature: 36.3
Patient temperature: 36.4
Patient temperature: 36.4
Patient temperature: 36.5
Patient temperature: 36.5
Potassium: 3 mmol/L — ABNORMAL LOW (ref 3.5–5.1)
Potassium: 3 mmol/L — ABNORMAL LOW (ref 3.5–5.1)
Potassium: 3.1 mmol/L — ABNORMAL LOW (ref 3.5–5.1)
Potassium: 3.2 mmol/L — ABNORMAL LOW (ref 3.5–5.1)
Potassium: 3.5 mmol/L (ref 3.5–5.1)
Potassium: 3.6 mmol/L (ref 3.5–5.1)
Potassium: 3.6 mmol/L (ref 3.5–5.1)
Potassium: 3.7 mmol/L (ref 3.5–5.1)
Potassium: 3.7 mmol/L (ref 3.5–5.1)
Sodium: 146 mmol/L — ABNORMAL HIGH (ref 135–145)
Sodium: 146 mmol/L — ABNORMAL HIGH (ref 135–145)
Sodium: 146 mmol/L — ABNORMAL HIGH (ref 135–145)
Sodium: 146 mmol/L — ABNORMAL HIGH (ref 135–145)
Sodium: 147 mmol/L — ABNORMAL HIGH (ref 135–145)
Sodium: 148 mmol/L — ABNORMAL HIGH (ref 135–145)
Sodium: 148 mmol/L — ABNORMAL HIGH (ref 135–145)
Sodium: 149 mmol/L — ABNORMAL HIGH (ref 135–145)
Sodium: 150 mmol/L — ABNORMAL HIGH (ref 135–145)
TCO2: 29 mmol/L (ref 22–32)
TCO2: 30 mmol/L (ref 22–32)
TCO2: 30 mmol/L (ref 22–32)
TCO2: 30 mmol/L (ref 22–32)
TCO2: 30 mmol/L (ref 22–32)
TCO2: 31 mmol/L (ref 22–32)
TCO2: 31 mmol/L (ref 22–32)
TCO2: 32 mmol/L (ref 22–32)
TCO2: 32 mmol/L (ref 22–32)
pCO2 arterial: 34.1 mmHg (ref 32–48)
pCO2 arterial: 35.6 mmHg (ref 32–48)
pCO2 arterial: 37.7 mmHg (ref 32–48)
pCO2 arterial: 43.8 mmHg (ref 32–48)
pCO2 arterial: 44.4 mmHg (ref 32–48)
pCO2 arterial: 44.9 mmHg (ref 32–48)
pCO2 arterial: 46 mmHg (ref 32–48)
pCO2 arterial: 46.9 mmHg (ref 32–48)
pCO2 arterial: 47.6 mmHg (ref 32–48)
pH, Arterial: 7.395 (ref 7.35–7.45)
pH, Arterial: 7.404 (ref 7.35–7.45)
pH, Arterial: 7.41 (ref 7.35–7.45)
pH, Arterial: 7.427 (ref 7.35–7.45)
pH, Arterial: 7.428 (ref 7.35–7.45)
pH, Arterial: 7.444 (ref 7.35–7.45)
pH, Arterial: 7.484 — ABNORMAL HIGH (ref 7.35–7.45)
pH, Arterial: 7.516 — ABNORMAL HIGH (ref 7.35–7.45)
pH, Arterial: 7.516 — ABNORMAL HIGH (ref 7.35–7.45)
pO2, Arterial: 101 mmHg (ref 83–108)
pO2, Arterial: 121 mmHg — ABNORMAL HIGH (ref 83–108)
pO2, Arterial: 152 mmHg — ABNORMAL HIGH (ref 83–108)
pO2, Arterial: 153 mmHg — ABNORMAL HIGH (ref 83–108)
pO2, Arterial: 169 mmHg — ABNORMAL HIGH (ref 83–108)
pO2, Arterial: 169 mmHg — ABNORMAL HIGH (ref 83–108)
pO2, Arterial: 189 mmHg — ABNORMAL HIGH (ref 83–108)
pO2, Arterial: 82 mmHg — ABNORMAL LOW (ref 83–108)
pO2, Arterial: 95 mmHg (ref 83–108)

## 2022-02-19 LAB — PREPARE PLATELET PHERESIS
Unit division: 0
Unit division: 0
Unit division: 0
Unit division: 0

## 2022-02-19 LAB — HEPATIC FUNCTION PANEL
ALT: 23 U/L (ref 0–44)
AST: 63 U/L — ABNORMAL HIGH (ref 15–41)
Albumin: 2.3 g/dL — ABNORMAL LOW (ref 3.5–5.0)
Alkaline Phosphatase: 29 U/L — ABNORMAL LOW (ref 38–126)
Bilirubin, Direct: 1.7 mg/dL — ABNORMAL HIGH (ref 0.0–0.2)
Indirect Bilirubin: 2.3 mg/dL — ABNORMAL HIGH (ref 0.3–0.9)
Total Bilirubin: 4 mg/dL — ABNORMAL HIGH (ref 0.3–1.2)
Total Protein: 4.1 g/dL — ABNORMAL LOW (ref 6.5–8.1)

## 2022-02-19 LAB — BPAM PLATELET PHERESIS
Blood Product Expiration Date: 202312262359
Blood Product Expiration Date: 202312262359
Blood Product Expiration Date: 202312252359
Blood Product Expiration Date: 202312262359
ISSUE DATE / TIME: 202312250408
ISSUE DATE / TIME: 202312250830
ISSUE DATE / TIME: 202312251238
ISSUE DATE / TIME: 202312251446
Unit Type and Rh: 5100
Unit Type and Rh: 6200
Unit Type and Rh: 6200
Unit Type and Rh: 7300

## 2022-02-19 LAB — MAGNESIUM
Magnesium: 1 mg/dL — ABNORMAL LOW (ref 1.7–2.4)
Magnesium: 2.3 mg/dL (ref 1.7–2.4)

## 2022-02-19 LAB — BASIC METABOLIC PANEL
Anion gap: 8 (ref 5–15)
Anion gap: 5 (ref 5–15)
BUN: 12 mg/dL (ref 6–20)
BUN: 11 mg/dL (ref 6–20)
CO2: 30 mmol/L (ref 22–32)
CO2: 30 mmol/L (ref 22–32)
Calcium: 7.8 mg/dL — ABNORMAL LOW (ref 8.9–10.3)
Calcium: 7.9 mg/dL — ABNORMAL LOW (ref 8.9–10.3)
Chloride: 111 mmol/L (ref 98–111)
Chloride: 113 mmol/L — ABNORMAL HIGH (ref 98–111)
Creatinine, Ser: 1.54 mg/dL — ABNORMAL HIGH (ref 0.61–1.24)
Creatinine, Ser: 1.39 mg/dL — ABNORMAL HIGH (ref 0.61–1.24)
GFR, Estimated: >60 mL/min (ref >60)
GFR, Estimated: >60 mL/min (ref >60)
Glucose, Bld: 134 mg/dL — ABNORMAL HIGH (ref 70–99)
Glucose, Bld: 106 mg/dL — ABNORMAL HIGH (ref 70–99)
Potassium: 3.5 mmol/L (ref 3.5–5.1)
Potassium: 3.1 mmol/L — ABNORMAL LOW (ref 3.5–5.1)
Sodium: 149 mmol/L — ABNORMAL HIGH (ref 135–145)
Sodium: 148 mmol/L — ABNORMAL HIGH (ref 135–145)

## 2022-02-19 LAB — BPAM CRYOPRECIPITATE
Blood Product Expiration Date: 202312251011
Blood Product Expiration Date: 202312251900
ISSUE DATE / TIME: 202312250428
ISSUE DATE / TIME: 202312251323
Unit Type and Rh: 5100
Unit Type and Rh: 5100

## 2022-02-19 LAB — CBC
HCT: 40.6 % (ref 39.0–52.0)
HCT: 37.1 % — ABNORMAL LOW (ref 39.0–52.0)
Hemoglobin: 14.1 g/dL (ref 13.0–17.0)
Hemoglobin: 13.4 g/dL (ref 13.0–17.0)
MCH: 29.5 pg (ref 26.0–34.0)
MCH: 30.5 pg (ref 26.0–34.0)
MCHC: 34.7 g/dL (ref 30.0–36.0)
MCHC: 36.1 g/dL — ABNORMAL HIGH (ref 30.0–36.0)
MCV: 84.9 fL (ref 80.0–100.0)
MCV: 84.3 fL (ref 80.0–100.0)
Platelets: 77 10*3/uL — ABNORMAL LOW (ref 150–400)
Platelets: 57 10*3/uL — ABNORMAL LOW (ref 150–400)
RBC: 4.78 MIL/uL (ref 4.22–5.81)
RBC: 4.4 MIL/uL (ref 4.22–5.81)
RDW: 15.9 % — ABNORMAL HIGH (ref 11.5–15.5)
RDW: 16.3 % — ABNORMAL HIGH (ref 11.5–15.5)
WBC: 9.5 10*3/uL (ref 4.0–10.5)
WBC: 9.9 10*3/uL (ref 4.0–10.5)
nRBC: 0 % (ref 0.0–0.2)
nRBC: 0.2 % (ref 0.0–0.2)

## 2022-02-19 LAB — PROTIME-INR
INR: 2.1 — ABNORMAL HIGH (ref 0.8–1.2)
Prothrombin Time: 23.5 seconds — ABNORMAL HIGH (ref 11.4–15.2)

## 2022-02-19 LAB — GLUCOSE, CAPILLARY
Glucose-Capillary: 122 mg/dL — ABNORMAL HIGH (ref 70–99)
Glucose-Capillary: 97 mg/dL (ref 70–99)
Glucose-Capillary: 92 mg/dL (ref 70–99)
Glucose-Capillary: 103 mg/dL — ABNORMAL HIGH (ref 70–99)
Glucose-Capillary: 92 mg/dL (ref 70–99)

## 2022-02-19 LAB — FIBRINOGEN: Fibrinogen: 316 mg/dL (ref 210–475)

## 2022-02-19 LAB — LACTATE DEHYDROGENASE: LDH: 358 U/L — ABNORMAL HIGH (ref 98–192)

## 2022-02-19 LAB — LACTIC ACID, PLASMA
Lactic Acid, Venous: 3.4 mmol/L (ref 0.5–1.9)
Lactic Acid, Venous: 2.7 mmol/L (ref 0.5–1.9)

## 2022-02-19 MED ORDER — MAGNESIUM SULFATE 2 GM/50ML IV SOLN: 2.0000 g | Freq: Once | INTRAVENOUS | Status: DC

## 2022-02-19 MED ORDER — PIVOT 1.5 CAL PO LIQD
1000.0000 mL | ORAL | Status: DC
  Administered 2022-02-19 – 2022-02-21 (×3): 1000 mL

## 2022-02-19 MED ORDER — POTASSIUM CHLORIDE 20 MEQ PO PACK: 60.0000 meq | PACK | Freq: Once | ORAL | Status: DC

## 2022-02-19 MED ORDER — HEPARIN SODIUM (PORCINE) 5000 UNIT/ML IJ SOLN
5000.0000 [IU] | Freq: Three times a day (TID) | INTRAMUSCULAR | Status: DC
  Administered 2022-02-19 – 2022-02-22 (×9): 5000 [IU] via SUBCUTANEOUS
  Filled 2022-02-19 (×7): qty 1

## 2022-02-19 MED ORDER — IOHEXOL 350 MG/ML SOLN
75.0000 mL | Freq: Once | INTRAVENOUS | Status: AC | PRN
  Administered 2022-02-19: 75 mL via INTRAVENOUS

## 2022-02-19 MED ORDER — FUROSEMIDE 10 MG/ML IJ SOLN
20.0000 mg | Freq: Three times a day (TID) | INTRAMUSCULAR | Status: DC
  Filled 2022-02-19: qty 2

## 2022-02-19 MED ORDER — HEPARIN SODIUM (PORCINE) 5000 UNIT/ML IJ SOLN: 5000.0000 [IU] | Freq: Three times a day (TID) | INTRAMUSCULAR | Status: DC

## 2022-02-19 MED ORDER — PROSOURCE TF20 ENFIT COMPATIBL EN LIQD
60.0000 mL | Freq: Every day | ENTERAL | Status: DC
  Administered 2022-02-19 – 2022-02-22 (×18): 60 mL
  Filled 2022-02-19 (×16): qty 60

## 2022-02-19 MED ORDER — MAGNESIUM SULFATE 2 GM/50ML IV SOLN: 2.0000 g | INTRAVENOUS | Status: DC

## 2022-02-19 MED ORDER — MAGNESIUM SULFATE 2 GM/50ML IV SOLN
2.0000 g | INTRAVENOUS | Status: AC
  Administered 2022-02-19 (×3): 2 g via INTRAVENOUS
  Filled 2022-02-19 (×3): qty 50

## 2022-02-19 MED ORDER — VITAL HIGH PROTEIN PO LIQD: 1000.0000 mL | ORAL | Status: DC

## 2022-02-19 MED ORDER — POTASSIUM CHLORIDE 10 MEQ/50ML IV SOLN
10.0000 meq | INTRAVENOUS | Status: AC
  Administered 2022-02-19 (×6): 10 meq via INTRAVENOUS
  Filled 2022-02-19 (×6): qty 50

## 2022-02-19 MED ORDER — PROSOURCE TF20 ENFIT COMPATIBL EN LIQD: 60.0000 mL | Freq: Every day | ENTERAL | Status: DC

## 2022-02-19 MED ORDER — POTASSIUM CHLORIDE 10 MEQ/50ML IV SOLN
10.0000 meq | INTRAVENOUS | Status: AC
  Administered 2022-02-19 (×4): 10 meq via INTRAVENOUS
  Filled 2022-02-19 (×3): qty 50

## 2022-02-19 MED ORDER — FREE WATER: 200.0000 mL | Status: DC
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
## 2022-02-19 NOTE — Progress Notes (Signed)
OT Cancellation Note  Patient Details Name: Steven Adams MRN: [id number] DOB: 24-Jan-2001   Cancelled Treatment:    Reason Eval/Treat Not Completed: Patient not medically ready- will follow and see as appropriate/able.   Barry Brunner, OT Acute Rehabilitation Services Office [phone]   Chancy Milroy 02/19/2022, 10:20 AM
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
## 2022-02-19 NOTE — Progress Notes (Signed)
PT Cancellation Note  Patient Details Name: Kysen Wetherington MRN: [id number] DOB: April 07, 2000   Cancelled Treatment:    Reason Eval/Treat Not Completed: Patient not medically ready, remains sedated, to have procedure today. Will check back tomorrow for appropriateness of PT Evaluation.  Ina Homes, PT, DPT Acute Rehabilitation Services  Personal: Secure Chat Rehab Office: [phone]  Malachy Chamber 02/19/2022, 10:29 AM
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
## 2022-02-19 NOTE — Procedures (Signed)
Bronchoscopy Procedure Note  Steven Adams  [id number]  2000/05/09  Date:02/19/22  Time:8:37 AM   Provider Performing:Demetria Lightsey P [name]   Procedure(s):  Subsequent Therapeutic Aspiration of Tracheobronchial Tree [phone])  Indication(s) ARDS Hemoptysis DAH  Consent Unable to obtain consent due to emergent nature of procedure.  Anesthesia Per MAR   Time Out Verified patient identification, verified procedure, site/side was marked, verified correct patient position, special equipment/implants available, medications/allergies/relevant history reviewed, required imaging and test results available.   Sterile Technique Usual hand hygiene, masks, gowns, and gloves were used   Procedure Description Bronchoscope advanced through endotracheal tube and into airway.  Airways were examined down to subsegmental level with findings noted below.    Findings: Clear left-sided airways. Small bloody plugs lavaged out of distal airways, no sighs of active bleeding. R upper lobe occluded with bloody mucus plug, removed with saline lavage. Rest of tracheobronchial teee open. Smaller plugs again lavaged out of all lobes. R bronchial wash sent for culture.    Complications/Tolerance None; patient tolerated the procedure well. Chest X-ray is not needed post procedure.   EBL 0 cc   Specimen(s) Bronchial wash on R   Steffanie Dunn, DO 02/19/22 8:39 AM [facility]
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
## 2022-02-19 NOTE — Progress Notes (Signed)
Transported patient to C.T while patient was on ECMO and ventilator support. Patient remained stable during transport.

## 2022-02-19 NOTE — Progress Notes (Signed)
RT assisted Dr. [name] with bedside bronch.

## 2022-02-19 NOTE — Progress Notes (Signed)
Pupil asymmetry- head CT. While there we will get CT chest. Not on [facility], so no change in intervention at this point.   Steffanie Dunn, DO 02/19/22 6:58 PM [facility]

## 2022-02-19 NOTE — Progress Notes (Signed)
   02/19/22 1200  Clinical Encounter Type  Visited With Family;Health care provider (Patient Sister)  Visit Type Initial;Spiritual support;Social support;Critical Care  Referral From Chaplain Danice Goltz)  Consult/Referral To Chaplain Albertina Parr [place])   Chaplain met with patient's sister to provide meaningful presence and reflective listening. Patient's mother to arrive shortly. Chaplain will continue to follow and provide emotional and spiritual support. [address] [place], [place]., [phone]
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
## 2022-02-19 NOTE — Procedures (Signed)
Bedside Bronchoscopy Procedure Note Steven Adams [id number] 07-27-2000  Procedure: Bronchoscopy Indications: Obtain specimens for culture and/or other diagnostic studies  Procedure Details: ET Tube Size: ET Tube secured at lip (cm): Bite block in place: Yes In preparation for procedure, Patient hyper-oxygenated with 100 % FiO2 and Saline given via ETT (7.5 ml) Airway entered and the following bronchi were examined: RUL.   Bronchoscope removed.  , Patient placed back on 100% FiO2 at conclusion of procedure.    Evaluation BP (!) 149/97   Pulse (!) 124   Temp (!) 97.5 F (36.4 C)   Resp (!) 0   Ht 5\' 10"  (1.778 m)   Wt 72.8 kg   SpO2 97%   BMI 23.03 kg/m  Breath Sounds:Diminished O2 sats: stable throughout Patient's Current Condition: stable Specimens:  Sent serosanguinous fluid Complications: No apparent complications Patient did tolerate procedure well.   [name] 02/19/2022, 8:38 AM
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
## 2022-02-20 ENCOUNTER — Inpatient Hospital Stay (HOSPITAL_COMMUNITY): Payer: Self-pay

## 2022-02-20 DIAGNOSIS — J90 Pleural effusion, not elsewhere classified: Secondary | ICD-10-CM

## 2022-02-20 DIAGNOSIS — R4182 Altered mental status, unspecified: Secondary | ICD-10-CM

## 2022-02-20 DIAGNOSIS — Z7189 Other specified counseling: Secondary | ICD-10-CM

## 2022-02-20 LAB — BASIC METABOLIC PANEL
Anion gap: 4 — ABNORMAL LOW (ref 5–15)
Anion gap: 4 — ABNORMAL LOW (ref 5–15)
BUN: 15 mg/dL (ref 6–20)
BUN: 18 mg/dL (ref 6–20)
CO2: 27 mmol/L (ref 22–32)
CO2: 27 mmol/L (ref 22–32)
Calcium: 8.3 mg/dL — ABNORMAL LOW (ref 8.9–10.3)
Calcium: 8.2 mg/dL — ABNORMAL LOW (ref 8.9–10.3)
Chloride: 113 mmol/L — ABNORMAL HIGH (ref 98–111)
Chloride: 112 mmol/L — ABNORMAL HIGH (ref 98–111)
Creatinine, Ser: 1.23 mg/dL (ref 0.61–1.24)
Creatinine, Ser: 1.03 mg/dL (ref 0.61–1.24)
GFR, Estimated: >60 mL/min (ref >60)
GFR, Estimated: >60 mL/min (ref >60)
Glucose, Bld: 119 mg/dL — ABNORMAL HIGH (ref 70–99)
Glucose, Bld: 144 mg/dL — ABNORMAL HIGH (ref 70–99)
Potassium: 4.1 mmol/L (ref 3.5–5.1)
Potassium: 3.9 mmol/L (ref 3.5–5.1)
Sodium: 144 mmol/L (ref 135–145)
Sodium: 143 mmol/L (ref 135–145)

## 2022-02-20 LAB — CBC
HCT: 40.4 % (ref 39.0–52.0)
HCT: 40.2 % (ref 39.0–52.0)
Hemoglobin: 13.8 g/dL (ref 13.0–17.0)
Hemoglobin: 13.7 g/dL (ref 13.0–17.0)
MCH: 30.1 pg (ref 26.0–34.0)
MCH: 30.4 pg (ref 26.0–34.0)
MCHC: 34.2 g/dL (ref 30.0–36.0)
MCHC: 34.1 g/dL (ref 30.0–36.0)
MCV: 88 fL (ref 80.0–100.0)
MCV: 89.1 fL (ref 80.0–100.0)
Platelets: 47 10*3/uL — ABNORMAL LOW (ref 150–400)
Platelets: 42 10*3/uL — ABNORMAL LOW (ref 150–400)
RBC: 4.59 MIL/uL (ref 4.22–5.81)
RBC: 4.51 MIL/uL (ref 4.22–5.81)
RDW: 17.1 % — ABNORMAL HIGH (ref 11.5–15.5)
RDW: 16.8 % — ABNORMAL HIGH (ref 11.5–15.5)
WBC: 10.2 10*3/uL (ref 4.0–10.5)
WBC: 9.9 10*3/uL (ref 4.0–10.5)
nRBC: 0 % (ref 0.0–0.2)
nRBC: 0.4 % — ABNORMAL HIGH (ref 0.0–0.2)

## 2022-02-20 LAB — RESP PANEL BY RT-PCR (RSV, FLU A&B, COVID)  RVPGX2
Influenza A by PCR: NEGATIVE
Influenza B by PCR: NEGATIVE
Resp Syncytial Virus by PCR: NEGATIVE
SARS Coronavirus 2 by RT PCR: NEGATIVE

## 2022-02-20 LAB — COMPREHENSIVE METABOLIC PANEL
ALT: 27 U/L (ref 0–44)
AST: 57 U/L — ABNORMAL HIGH (ref 15–41)
Albumin: 2.6 g/dL — ABNORMAL LOW (ref 3.5–5.0)
Alkaline Phosphatase: 47 U/L (ref 38–126)
Anion gap: 4 — ABNORMAL LOW (ref 5–15)
BUN: 19 mg/dL (ref 6–20)
CO2: 26 mmol/L (ref 22–32)
Calcium: 8.3 mg/dL — ABNORMAL LOW (ref 8.9–10.3)
Chloride: 113 mmol/L — ABNORMAL HIGH (ref 98–111)
Creatinine, Ser: 0.88 mg/dL (ref 0.61–1.24)
GFR, Estimated: >60 mL/min (ref >60)
Glucose, Bld: 113 mg/dL — ABNORMAL HIGH (ref 70–99)
Potassium: 3.8 mmol/L (ref 3.5–5.1)
Sodium: 143 mmol/L (ref 135–145)
Total Bilirubin: 3.9 mg/dL — ABNORMAL HIGH (ref 0.3–1.2)
Total Protein: 4.7 g/dL — ABNORMAL LOW (ref 6.5–8.1)

## 2022-02-20 LAB — HEPATIC FUNCTION PANEL
ALT: 26 U/L (ref 0–44)
AST: 68 U/L — ABNORMAL HIGH (ref 15–41)
Albumin: 2.5 g/dL — ABNORMAL LOW (ref 3.5–5.0)
Alkaline Phosphatase: 35 U/L — ABNORMAL LOW (ref 38–126)
Bilirubin, Direct: 2.4 mg/dL — ABNORMAL HIGH (ref 0.0–0.2)
Indirect Bilirubin: 2.2 mg/dL — ABNORMAL HIGH (ref 0.3–0.9)
Total Bilirubin: 4.6 mg/dL — ABNORMAL HIGH (ref 0.3–1.2)
Total Protein: 4.5 g/dL — ABNORMAL LOW (ref 6.5–8.1)

## 2022-02-20 LAB — PREPARE FRESH FROZEN PLASMA
Unit division: 0
Unit division: 0
Unit division: 0
Unit division: 0
Unit division: 0

## 2022-02-20 LAB — PROTIME-INR
INR: 1.5 — ABNORMAL HIGH (ref 0.8–1.2)
INR: 1.2 (ref 0.8–1.2)
Prothrombin Time: 18.2 seconds — ABNORMAL HIGH (ref 11.4–15.2)
Prothrombin Time: 15.4 seconds — ABNORMAL HIGH (ref 11.4–15.2)

## 2022-02-20 LAB — POCT I-STAT 7, (LYTES, BLD GAS, ICA,H+H)
Acid-Base Excess: 2 mmol/L (ref 0.0–2.0)
Acid-Base Excess: 2 mmol/L (ref 0.0–2.0)
Acid-Base Excess: 2 mmol/L (ref 0.0–2.0)
Acid-Base Excess: 3 mmol/L — ABNORMAL HIGH (ref 0.0–2.0)
Acid-Base Excess: 3 mmol/L — ABNORMAL HIGH (ref 0.0–2.0)
Bicarbonate: 26.1 mmol/L (ref 20.0–28.0)
Bicarbonate: 28 mmol/L (ref 20.0–28.0)
Bicarbonate: 28.1 mmol/L — ABNORMAL HIGH (ref 20.0–28.0)
Bicarbonate: 28.4 mmol/L — ABNORMAL HIGH (ref 20.0–28.0)
Bicarbonate: 28.8 mmol/L — ABNORMAL HIGH (ref 20.0–28.0)
Calcium, Ion: 1.23 mmol/L (ref 1.15–1.40)
Calcium, Ion: 1.23 mmol/L (ref 1.15–1.40)
Calcium, Ion: 1.26 mmol/L (ref 1.15–1.40)
Calcium, Ion: 1.27 mmol/L (ref 1.15–1.40)
Calcium, Ion: 1.28 mmol/L (ref 1.15–1.40)
HCT: 35 % — ABNORMAL LOW (ref 39.0–52.0)
HCT: 36 % — ABNORMAL LOW (ref 39.0–52.0)
HCT: 37 % — ABNORMAL LOW (ref 39.0–52.0)
HCT: 37 % — ABNORMAL LOW (ref 39.0–52.0)
HCT: 37 % — ABNORMAL LOW (ref 39.0–52.0)
Hemoglobin: 11.9 g/dL — ABNORMAL LOW (ref 13.0–17.0)
Hemoglobin: 12.2 g/dL — ABNORMAL LOW (ref 13.0–17.0)
Hemoglobin: 12.6 g/dL — ABNORMAL LOW (ref 13.0–17.0)
Hemoglobin: 12.6 g/dL — ABNORMAL LOW (ref 13.0–17.0)
Hemoglobin: 12.6 g/dL — ABNORMAL LOW (ref 13.0–17.0)
O2 Saturation: 100 %
O2 Saturation: 99 %
O2 Saturation: 99 %
O2 Saturation: 99 %
O2 Saturation: 99 %
Patient temperature: 36.3
Patient temperature: 36.3
Patient temperature: 36.4
Patient temperature: 36.4
Patient temperature: 36.6
Potassium: 3.6 mmol/L (ref 3.5–5.1)
Potassium: 3.7 mmol/L (ref 3.5–5.1)
Potassium: 3.7 mmol/L (ref 3.5–5.1)
Potassium: 4 mmol/L (ref 3.5–5.1)
Potassium: 4.1 mmol/L (ref 3.5–5.1)
Sodium: 143 mmol/L (ref 135–145)
Sodium: 143 mmol/L (ref 135–145)
Sodium: 144 mmol/L (ref 135–145)
Sodium: 144 mmol/L (ref 135–145)
Sodium: 144 mmol/L (ref 135–145)
TCO2: 27 mmol/L (ref 22–32)
TCO2: 30 mmol/L (ref 22–32)
TCO2: 30 mmol/L (ref 22–32)
TCO2: 30 mmol/L (ref 22–32)
TCO2: 30 mmol/L (ref 22–32)
pCO2 arterial: 37.2 mmHg (ref 32–48)
pCO2 arterial: 46.1 mmHg (ref 32–48)
pCO2 arterial: 47.1 mmHg (ref 32–48)
pCO2 arterial: 48.5 mmHg — ABNORMAL HIGH (ref 32–48)
pCO2 arterial: 48.9 mmHg — ABNORMAL HIGH (ref 32–48)
pH, Arterial: 7.364 (ref 7.35–7.45)
pH, Arterial: 7.369 (ref 7.35–7.45)
pH, Arterial: 7.392 (ref 7.35–7.45)
pH, Arterial: 7.395 (ref 7.35–7.45)
pH, Arterial: 7.452 — ABNORMAL HIGH (ref 7.35–7.45)
pO2, Arterial: 136 mmHg — ABNORMAL HIGH (ref 83–108)
pO2, Arterial: 141 mmHg — ABNORMAL HIGH (ref 83–108)
pO2, Arterial: 143 mmHg — ABNORMAL HIGH (ref 83–108)
pO2, Arterial: 146 mmHg — ABNORMAL HIGH (ref 83–108)
pO2, Arterial: 250 mmHg — ABNORMAL HIGH (ref 83–108)

## 2022-02-20 LAB — URINALYSIS, ROUTINE W REFLEX MICROSCOPIC
Bacteria, UA: NONE SEEN
Bilirubin Urine: NEGATIVE
Glucose, UA: NEGATIVE mg/dL
Ketones, ur: NEGATIVE mg/dL
Leukocytes,Ua: NEGATIVE
Nitrite: NEGATIVE
Protein, ur: 100 mg/dL — AB
Specific Gravity, Urine: 1.028 (ref 1.005–1.030)
pH: 7 (ref 5.0–8.0)

## 2022-02-20 LAB — MAGNESIUM
Magnesium: 2 mg/dL (ref 1.7–2.4)
Magnesium: 2.2 mg/dL (ref 1.7–2.4)

## 2022-02-20 LAB — BPAM FFP
Blood Product Expiration Date: 202312252359
Blood Product Expiration Date: 202312272359
Blood Product Expiration Date: 202312272359
Blood Product Expiration Date: 202312272359
Blood Product Expiration Date: 202312292359
Blood Product Expiration Date: 202312292359
Blood Product Expiration Date: 202312292359
Blood Product Expiration Date: 202312292359
ISSUE DATE / TIME: 202312250407
ISSUE DATE / TIME: 202312250407
ISSUE DATE / TIME: 202312250407
ISSUE DATE / TIME: 202312250407
ISSUE DATE / TIME: 202312250419
ISSUE DATE / TIME: 202312250419
ISSUE DATE / TIME: 202312250419
ISSUE DATE / TIME: 202312250419
Unit Type and Rh: 600
Unit Type and Rh: 6200
Unit Type and Rh: 6200
Unit Type and Rh: 6200
Unit Type and Rh: 6200
Unit Type and Rh: 6200
Unit Type and Rh: 6200
Unit Type and Rh: 6200

## 2022-02-20 LAB — LIPASE, BLOOD: Lipase: 31 U/L (ref 11–51)

## 2022-02-20 LAB — GLUCOSE, CAPILLARY
Glucose-Capillary: 106 mg/dL — ABNORMAL HIGH (ref 70–99)
Glucose-Capillary: 110 mg/dL — ABNORMAL HIGH (ref 70–99)
Glucose-Capillary: 115 mg/dL — ABNORMAL HIGH (ref 70–99)
Glucose-Capillary: 117 mg/dL — ABNORMAL HIGH (ref 70–99)
Glucose-Capillary: 127 mg/dL — ABNORMAL HIGH (ref 70–99)
Glucose-Capillary: 96 mg/dL (ref 70–99)

## 2022-02-20 LAB — CK TOTAL AND CKMB (NOT AT ARMC)
CK, MB: 3 ng/mL (ref 0.5–5.0)
Relative Index: 0.6 (ref 0.0–2.5)
Total CK: 512 U/L — ABNORMAL HIGH (ref 49–397)

## 2022-02-20 LAB — POCT ACTIVATED CLOTTING TIME: Activated Clotting Time: 0 seconds

## 2022-02-20 LAB — LACTATE DEHYDROGENASE: LDH: 408 U/L — ABNORMAL HIGH (ref 98–192)

## 2022-02-20 LAB — BILIRUBIN, DIRECT: Bilirubin, Direct: 1.8 mg/dL — ABNORMAL HIGH (ref 0.0–0.2)

## 2022-02-20 LAB — LACTIC ACID, PLASMA: Lactic Acid, Venous: 1.5 mmol/L (ref 0.5–1.9)

## 2022-02-20 LAB — PHOSPHORUS
Phosphorus: 2.4 mg/dL — ABNORMAL LOW (ref 2.5–4.6)
Phosphorus: 2.1 mg/dL — ABNORMAL LOW (ref 2.5–4.6)

## 2022-02-20 LAB — FIBRINOGEN: Fibrinogen: 557 mg/dL — ABNORMAL HIGH (ref 210–475)

## 2022-02-20 LAB — APTT: aPTT: 31 seconds (ref 24–36)

## 2022-02-20 LAB — AMYLASE: Amylase: 82 U/L (ref 28–100)

## 2022-02-20 MED ORDER — SODIUM PHOSPHATES 45 MMOLE/15ML IV SOLN
20.0000 mmol | Freq: Once | INTRAVENOUS | Status: AC
  Administered 2022-02-20: 20 mmol via INTRAVENOUS
  Filled 2022-02-20: qty 6.67

## 2022-02-20 MED ORDER — MAGNESIUM SULFATE 2 GM/50ML IV SOLN
2.0000 g | Freq: Once | INTRAVENOUS | Status: AC
  Administered 2022-02-20: 2 g via INTRAVENOUS
  Filled 2022-02-20: qty 50

## 2022-02-20 NOTE — Progress Notes (Signed)
Attempted to call [employer] with pt status update but he did not answer and mailbox was full so could not leave voicemail. Will try again later.
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
## 2022-02-20 NOTE — Progress Notes (Signed)
   02/04/2022 1400  Clinical Encounter Type  Visited With Family  Visit Type Follow-up;Spiritual support;Critical Care  Referral From Chaplain (Self)  Consult/Referral To Chaplain Albertina Parr Morene Crocker)  Spiritual Encounters  Spiritual Needs Emotional;Grief support   Chaplain met with patient's mother: Cai Anfinson; Sister; and family friend at patient's bedside. Chaplain provided meaningful presence and reflective listening as mother expressed her anticipatory grief. [facility] representatives on hand to meet with family. Chaplain provided hospitality and silent intercessory prayer. [id number] [place], [place]., [phone]
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
## 2022-02-20 NOTE — Progress Notes (Signed)
Clinical update provided to patient's grandmother after her arrival from [place]. Detailed explanation of anoxic brain injury and its pathophysiology in this case was provided. All questions were answered. Mother has elected for organ donation since our last conversation. Unable to perform a classic apnea test due to ECMO, however, clinical brain death examination was performed with plan for EEG as ancillary testing. Clinical brain death testing performed at the bedside and explained in detail to family. All sedation has been off since 02/19/22 at 1432 (greater than 24h). Exam perfromed at 1550 with results: no pupillary response to light bilaterally, no eyelid response to gauze application to b/l corneas, no blink to threat, no cough/gag in response to ETT manipulation or ETT suction catheter insertion, eyes remained midline during bilateral oculocephalic reflex testing, no eye movement response to bilateral vestibulo-ocular reflex testing with ice cold water, no spontaneous respiratory effort. Await results of EEG.   Critical care time:  Diamantina Monks, MD General and Trauma Surgery [facility] Surgery
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
## 2022-02-20 NOTE — Progress Notes (Signed)
OT Cancellation Note  Patient Details Name: Steven Adams MRN: [id number] DOB: 12-16-2000   Cancelled Treatment:    Reason Eval/Treat Not Completed: Patient not medically ready (Please re-order when appropriate.)  Donia Pounds 02-27-22, 9:15 AM

## 2022-02-20 NOTE — Progress Notes (Signed)
PT Cancellation Note  Patient Details Name: Steven Adams MRN: [id number] DOB: Mar 08, 2000   Cancelled Treatment:    Reason Eval/Treat Not Completed: Patient not medically ready, will sign off. Please reorder if new needs arise.  Ina Homes, PT, DPT Acute Rehabilitation Services  Personal: Secure Chat Rehab Office: [phone]  Malachy Chamber 03/10/22, 8:39 AM

## 2022-02-20 NOTE — Progress Notes (Signed)
EEG complete - results pending 
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
## 2022-02-20 NOTE — Progress Notes (Signed)
Fentanyl and versed gtt orders discontinued. 100mg  (100cc) versed and 4500 mcg (90cc) fentanyl wasted in 2H med room Stericycle bin with RN. Versed waste documented in Pyxis but unable to waste fentanyl via Pyxis since it was sent up from pharmacy.
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
## 2022-02-20 NOTE — Progress Notes (Signed)
Patient transported to and from CT with ES x2, RN x 2, RT, and MD with backup circuit, cooler of blood, and emergency drugs. All safety checks performed, oxygen tank full, battery on ECMO circuit full. Left unit at 1909, returned to unit 1957. No complications during transport, patient tolerated well. Safety checks performed again on arrival to unit. Hooked back up to wall oxygen, and plugged ECMO circuit back into wall power.   Emergency drugs returned to pyxis and blood cooler returned to blood bank.
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
## 2022-02-21 ENCOUNTER — Inpatient Hospital Stay (HOSPITAL_COMMUNITY): Payer: Self-pay

## 2022-02-21 DIAGNOSIS — Z529 Donor of unspecified organ or tissue: Secondary | ICD-10-CM

## 2022-02-21 LAB — COMPREHENSIVE METABOLIC PANEL
ALT: 29 U/L (ref 0–44)
ALT: 26 U/L (ref 0–44)
ALT: 30 U/L (ref 0–44)
ALT: 31 U/L (ref 0–44)
AST: 52 U/L — ABNORMAL HIGH (ref 15–41)
AST: 47 U/L — ABNORMAL HIGH (ref 15–41)
AST: 52 U/L — ABNORMAL HIGH (ref 15–41)
AST: 52 U/L — ABNORMAL HIGH (ref 15–41)
Albumin: 2.6 g/dL — ABNORMAL LOW (ref 3.5–5.0)
Albumin: 2.5 g/dL — ABNORMAL LOW (ref 3.5–5.0)
Albumin: 2.4 g/dL — ABNORMAL LOW (ref 3.5–5.0)
Albumin: 2.3 g/dL — ABNORMAL LOW (ref 3.5–5.0)
Alkaline Phosphatase: 49 U/L (ref 38–126)
Alkaline Phosphatase: 48 U/L (ref 38–126)
Alkaline Phosphatase: 47 U/L (ref 38–126)
Alkaline Phosphatase: 48 U/L (ref 38–126)
Anion gap: 6 (ref 5–15)
Anion gap: 4 — ABNORMAL LOW (ref 5–15)
Anion gap: 4 — ABNORMAL LOW (ref 5–15)
Anion gap: 4 — ABNORMAL LOW (ref 5–15)
BUN: 19 mg/dL (ref 6–20)
BUN: 20 mg/dL (ref 6–20)
BUN: 24 mg/dL — ABNORMAL HIGH (ref 6–20)
BUN: 28 mg/dL — ABNORMAL HIGH (ref 6–20)
CO2: 26 mmol/L (ref 22–32)
CO2: 27 mmol/L (ref 22–32)
CO2: 30 mmol/L (ref 22–32)
CO2: 28 mmol/L (ref 22–32)
Calcium: 8.5 mg/dL — ABNORMAL LOW (ref 8.9–10.3)
Calcium: 8.5 mg/dL — ABNORMAL LOW (ref 8.9–10.3)
Calcium: 8.2 mg/dL — ABNORMAL LOW (ref 8.9–10.3)
Calcium: 8.4 mg/dL — ABNORMAL LOW (ref 8.9–10.3)
Chloride: 113 mmol/L — ABNORMAL HIGH (ref 98–111)
Chloride: 114 mmol/L — ABNORMAL HIGH (ref 98–111)
Chloride: 114 mmol/L — ABNORMAL HIGH (ref 98–111)
Chloride: 116 mmol/L — ABNORMAL HIGH (ref 98–111)
Creatinine, Ser: 0.87 mg/dL (ref 0.61–1.24)
Creatinine, Ser: 1 mg/dL (ref 0.61–1.24)
Creatinine, Ser: 0.82 mg/dL (ref 0.61–1.24)
Creatinine, Ser: 0.84 mg/dL (ref 0.61–1.24)
GFR, Estimated: >60 mL/min (ref >60)
GFR, Estimated: >60 mL/min (ref >60)
GFR, Estimated: >60 mL/min (ref >60)
GFR, Estimated: >60 mL/min (ref >60)
Glucose, Bld: 119 mg/dL — ABNORMAL HIGH (ref 70–99)
Glucose, Bld: 116 mg/dL — ABNORMAL HIGH (ref 70–99)
Glucose, Bld: 104 mg/dL — ABNORMAL HIGH (ref 70–99)
Glucose, Bld: 113 mg/dL — ABNORMAL HIGH (ref 70–99)
Potassium: 3.7 mmol/L (ref 3.5–5.1)
Potassium: 3.6 mmol/L (ref 3.5–5.1)
Potassium: 3.8 mmol/L (ref 3.5–5.1)
Potassium: 3.8 mmol/L (ref 3.5–5.1)
Sodium: 145 mmol/L (ref 135–145)
Sodium: 145 mmol/L (ref 135–145)
Sodium: 148 mmol/L — ABNORMAL HIGH (ref 135–145)
Sodium: 148 mmol/L — ABNORMAL HIGH (ref 135–145)
Total Bilirubin: 4.1 mg/dL — ABNORMAL HIGH (ref 0.3–1.2)
Total Bilirubin: 3.9 mg/dL — ABNORMAL HIGH (ref 0.3–1.2)
Total Bilirubin: 3.6 mg/dL — ABNORMAL HIGH (ref 0.3–1.2)
Total Bilirubin: 3.3 mg/dL — ABNORMAL HIGH (ref 0.3–1.2)
Total Protein: 5.1 g/dL — ABNORMAL LOW (ref 6.5–8.1)
Total Protein: 4.9 g/dL — ABNORMAL LOW (ref 6.5–8.1)
Total Protein: 4.7 g/dL — ABNORMAL LOW (ref 6.5–8.1)
Total Protein: 4.8 g/dL — ABNORMAL LOW (ref 6.5–8.1)

## 2022-02-21 LAB — POCT I-STAT 7, (LYTES, BLD GAS, ICA,H+H)
Acid-Base Excess: 2 mmol/L (ref 0.0–2.0)
Acid-Base Excess: 3 mmol/L — ABNORMAL HIGH (ref 0.0–2.0)
Acid-Base Excess: 3 mmol/L — ABNORMAL HIGH (ref 0.0–2.0)
Acid-Base Excess: 4 mmol/L — ABNORMAL HIGH (ref 0.0–2.0)
Acid-Base Excess: 4 mmol/L — ABNORMAL HIGH (ref 0.0–2.0)
Acid-Base Excess: 3 mmol/L — ABNORMAL HIGH (ref 0.0–2.0)
Acid-Base Excess: 4 mmol/L — ABNORMAL HIGH (ref 0.0–2.0)
Bicarbonate: 27 mmol/L (ref 20.0–28.0)
Bicarbonate: 26.6 mmol/L (ref 20.0–28.0)
Bicarbonate: 27.3 mmol/L (ref 20.0–28.0)
Bicarbonate: 30.6 mmol/L — ABNORMAL HIGH (ref 20.0–28.0)
Bicarbonate: 30.7 mmol/L — ABNORMAL HIGH (ref 20.0–28.0)
Bicarbonate: 29.8 mmol/L — ABNORMAL HIGH (ref 20.0–28.0)
Bicarbonate: 29.7 mmol/L — ABNORMAL HIGH (ref 20.0–28.0)
Calcium, Ion: 1.25 mmol/L (ref 1.15–1.40)
Calcium, Ion: 1.24 mmol/L (ref 1.15–1.40)
Calcium, Ion: 1.27 mmol/L (ref 1.15–1.40)
Calcium, Ion: 1.27 mmol/L (ref 1.15–1.40)
Calcium, Ion: 1.28 mmol/L (ref 1.15–1.40)
Calcium, Ion: 1.29 mmol/L (ref 1.15–1.40)
Calcium, Ion: 1.29 mmol/L (ref 1.15–1.40)
HCT: 36 % — ABNORMAL LOW (ref 39.0–52.0)
HCT: 35 % — ABNORMAL LOW (ref 39.0–52.0)
HCT: 35 % — ABNORMAL LOW (ref 39.0–52.0)
HCT: 34 % — ABNORMAL LOW (ref 39.0–52.0)
HCT: 32 % — ABNORMAL LOW (ref 39.0–52.0)
HCT: 33 % — ABNORMAL LOW (ref 39.0–52.0)
HCT: 33 % — ABNORMAL LOW (ref 39.0–52.0)
Hemoglobin: 12.2 g/dL — ABNORMAL LOW (ref 13.0–17.0)
Hemoglobin: 11.9 g/dL — ABNORMAL LOW (ref 13.0–17.0)
Hemoglobin: 11.9 g/dL — ABNORMAL LOW (ref 13.0–17.0)
Hemoglobin: 11.6 g/dL — ABNORMAL LOW (ref 13.0–17.0)
Hemoglobin: 10.9 g/dL — ABNORMAL LOW (ref 13.0–17.0)
Hemoglobin: 11.2 g/dL — ABNORMAL LOW (ref 13.0–17.0)
Hemoglobin: 11.2 g/dL — ABNORMAL LOW (ref 13.0–17.0)
O2 Saturation: 100 %
O2 Saturation: 100 %
O2 Saturation: 100 %
O2 Saturation: 99 %
O2 Saturation: 99 %
O2 Saturation: 97 %
O2 Saturation: 98 %
Patient temperature: 36.2
Patient temperature: 36.3
Patient temperature: 36.3
Patient temperature: 36.6
Patient temperature: 36.5
Patient temperature: 36.7
Patient temperature: 36.8
Potassium: 3.6 mmol/L (ref 3.5–5.1)
Potassium: 3.5 mmol/L (ref 3.5–5.1)
Potassium: 3.6 mmol/L (ref 3.5–5.1)
Potassium: 3.7 mmol/L (ref 3.5–5.1)
Potassium: 3.6 mmol/L (ref 3.5–5.1)
Potassium: 3.6 mmol/L (ref 3.5–5.1)
Potassium: 3.7 mmol/L (ref 3.5–5.1)
Sodium: 144 mmol/L (ref 135–145)
Sodium: 144 mmol/L (ref 135–145)
Sodium: 147 mmol/L — ABNORMAL HIGH (ref 135–145)
Sodium: 147 mmol/L — ABNORMAL HIGH (ref 135–145)
Sodium: 147 mmol/L — ABNORMAL HIGH (ref 135–145)
Sodium: 148 mmol/L — ABNORMAL HIGH (ref 135–145)
Sodium: 148 mmol/L — ABNORMAL HIGH (ref 135–145)
TCO2: 28 mmol/L (ref 22–32)
TCO2: 28 mmol/L (ref 22–32)
TCO2: 29 mmol/L (ref 22–32)
TCO2: 32 mmol/L (ref 22–32)
TCO2: 32 mmol/L (ref 22–32)
TCO2: 31 mmol/L (ref 22–32)
TCO2: 31 mmol/L (ref 22–32)
pCO2 arterial: 41 mmHg (ref 32–48)
pCO2 arterial: 33.6 mmHg (ref 32–48)
pCO2 arterial: 39.3 mmHg (ref 32–48)
pCO2 arterial: 51.7 mmHg — ABNORMAL HIGH (ref 32–48)
pCO2 arterial: 55.8 mmHg — ABNORMAL HIGH (ref 32–48)
pCO2 arterial: 52.4 mmHg — ABNORMAL HIGH (ref 32–48)
pCO2 arterial: 49.3 mmHg — ABNORMAL HIGH (ref 32–48)
pH, Arterial: 7.424 (ref 7.35–7.45)
pH, Arterial: 7.504 — ABNORMAL HIGH (ref 7.35–7.45)
pH, Arterial: 7.447 (ref 7.35–7.45)
pH, Arterial: 7.378 (ref 7.35–7.45)
pH, Arterial: 7.346 — ABNORMAL LOW (ref 7.35–7.45)
pH, Arterial: 7.361 (ref 7.35–7.45)
pH, Arterial: 7.386 (ref 7.35–7.45)
pO2, Arterial: 165 mmHg — ABNORMAL HIGH (ref 83–108)
pO2, Arterial: 177 mmHg — ABNORMAL HIGH (ref 83–108)
pO2, Arterial: 171 mmHg — ABNORMAL HIGH (ref 83–108)
pO2, Arterial: 163 mmHg — ABNORMAL HIGH (ref 83–108)
pO2, Arterial: 127 mmHg — ABNORMAL HIGH (ref 83–108)
pO2, Arterial: 98 mmHg (ref 83–108)
pO2, Arterial: 103 mmHg (ref 83–108)

## 2022-02-21 LAB — CBC
HCT: 39.3 % (ref 39.0–52.0)
HCT: 38.4 % — ABNORMAL LOW (ref 39.0–52.0)
HCT: 37.7 % — ABNORMAL LOW (ref 39.0–52.0)
HCT: 37.7 % — ABNORMAL LOW (ref 39.0–52.0)
Hemoglobin: 13.4 g/dL (ref 13.0–17.0)
Hemoglobin: 12.8 g/dL — ABNORMAL LOW (ref 13.0–17.0)
Hemoglobin: 12.6 g/dL — ABNORMAL LOW (ref 13.0–17.0)
Hemoglobin: 11.9 g/dL — ABNORMAL LOW (ref 13.0–17.0)
MCH: 30.5 pg (ref 26.0–34.0)
MCH: 30 pg (ref 26.0–34.0)
MCH: 30.2 pg (ref 26.0–34.0)
MCH: 29.4 pg (ref 26.0–34.0)
MCHC: 34.1 g/dL (ref 30.0–36.0)
MCHC: 33.3 g/dL (ref 30.0–36.0)
MCHC: 33.4 g/dL (ref 30.0–36.0)
MCHC: 31.6 g/dL (ref 30.0–36.0)
MCV: 89.3 fL (ref 80.0–100.0)
MCV: 89.9 fL (ref 80.0–100.0)
MCV: 90.4 fL (ref 80.0–100.0)
MCV: 93.1 fL (ref 80.0–100.0)
Platelets: 38 10*3/uL — ABNORMAL LOW (ref 150–400)
Platelets: 39 10*3/uL — ABNORMAL LOW (ref 150–400)
Platelets: 39 10*3/uL — ABNORMAL LOW (ref 150–400)
Platelets: 37 10*3/uL — ABNORMAL LOW (ref 150–400)
RBC: 4.4 MIL/uL (ref 4.22–5.81)
RBC: 4.27 MIL/uL (ref 4.22–5.81)
RBC: 4.17 MIL/uL — ABNORMAL LOW (ref 4.22–5.81)
RBC: 4.05 MIL/uL — ABNORMAL LOW (ref 4.22–5.81)
RDW: 16.8 % — ABNORMAL HIGH (ref 11.5–15.5)
RDW: 16.8 % — ABNORMAL HIGH (ref 11.5–15.5)
RDW: 16.6 % — ABNORMAL HIGH (ref 11.5–15.5)
RDW: 16.8 % — ABNORMAL HIGH (ref 11.5–15.5)
WBC: 11.1 10*3/uL — ABNORMAL HIGH (ref 4.0–10.5)
WBC: 11.7 10*3/uL — ABNORMAL HIGH (ref 4.0–10.5)
WBC: 12.1 10*3/uL — ABNORMAL HIGH (ref 4.0–10.5)
WBC: 10.6 10*3/uL — ABNORMAL HIGH (ref 4.0–10.5)
nRBC: 0.4 % — ABNORMAL HIGH (ref 0.0–0.2)
nRBC: 0.4 % — ABNORMAL HIGH (ref 0.0–0.2)
nRBC: 0.6 % — ABNORMAL HIGH (ref 0.0–0.2)
nRBC: 0.8 % — ABNORMAL HIGH (ref 0.0–0.2)

## 2022-02-21 LAB — ECHOCARDIOGRAM COMPLETE
AR max vel: 2.32 cm2
AV Area VTI: 2.2 cm2
AV Area mean vel: 2.23 cm2
AV Mean grad: 4 mmHg
AV Peak grad: 6.4 mmHg
Ao pk vel: 1.26 m/s
Area-P 1/2: 3.81 cm2
Calc EF: 54.8 %
Height: 70 in
MV M vel: 2.58 m/s
MV Peak grad: 26.6 mmHg
S' Lateral: 3.1 cm
Single Plane A2C EF: 54.4 %
Single Plane A4C EF: 50.2 %
Weight: 2546.75 oz

## 2022-02-21 LAB — URINALYSIS, ROUTINE W REFLEX MICROSCOPIC
Glucose, UA: 50 mg/dL — AB
Ketones, ur: NEGATIVE mg/dL
Leukocytes,Ua: NEGATIVE
Nitrite: NEGATIVE
Protein, ur: 100 mg/dL — AB
Specific Gravity, Urine: 1.026 (ref 1.005–1.030)
pH: 6 (ref 5.0–8.0)

## 2022-02-21 LAB — MAGNESIUM
Magnesium: 2.2 mg/dL (ref 1.7–2.4)
Magnesium: 2.3 mg/dL (ref 1.7–2.4)
Magnesium: 2.3 mg/dL (ref 1.7–2.4)
Magnesium: 2.3 mg/dL (ref 1.7–2.4)

## 2022-02-21 LAB — GLUCOSE, CAPILLARY
Glucose-Capillary: 115 mg/dL — ABNORMAL HIGH (ref 70–99)
Glucose-Capillary: 116 mg/dL — ABNORMAL HIGH (ref 70–99)
Glucose-Capillary: 117 mg/dL — ABNORMAL HIGH (ref 70–99)
Glucose-Capillary: 127 mg/dL — ABNORMAL HIGH (ref 70–99)
Glucose-Capillary: 135 mg/dL — ABNORMAL HIGH (ref 70–99)
Glucose-Capillary: 98 mg/dL (ref 70–99)

## 2022-02-21 LAB — LIPASE, BLOOD
Lipase: 46 U/L (ref 11–51)
Lipase: 42 U/L (ref 11–51)
Lipase: 46 U/L (ref 11–51)
Lipase: 49 U/L (ref 11–51)

## 2022-02-21 LAB — CK TOTAL AND CKMB (NOT AT ARMC)
CK, MB: 5.6 ng/mL — ABNORMAL HIGH (ref 0.5–5.0)
CK, MB: 3.4 ng/mL (ref 0.5–5.0)
CK, MB: 1.6 ng/mL (ref 0.5–5.0)
CK, MB: 1.5 ng/mL (ref 0.5–5.0)
Relative Index: 1.1 (ref 0.0–2.5)
Relative Index: 0.7 (ref 0.0–2.5)
Relative Index: 0.3 (ref 0.0–2.5)
Relative Index: 0.4 (ref 0.0–2.5)
Total CK: 488 U/L — ABNORMAL HIGH (ref 49–397)
Total CK: 478 U/L — ABNORMAL HIGH (ref 49–397)
Total CK: 470 U/L — ABNORMAL HIGH (ref 49–397)
Total CK: 422 U/L — ABNORMAL HIGH (ref 49–397)

## 2022-02-21 LAB — PROTIME-INR
INR: 1.2 (ref 0.8–1.2)
INR: 1.2 (ref 0.8–1.2)
INR: 1.1 (ref 0.8–1.2)
INR: 1.2 (ref 0.8–1.2)
Prothrombin Time: 15.1 seconds (ref 11.4–15.2)
Prothrombin Time: 15.2 seconds (ref 11.4–15.2)
Prothrombin Time: 14.3 seconds (ref 11.4–15.2)
Prothrombin Time: 14.9 seconds (ref 11.4–15.2)

## 2022-02-21 LAB — AMYLASE
Amylase: 113 U/L — ABNORMAL HIGH (ref 28–100)
Amylase: 124 U/L — ABNORMAL HIGH (ref 28–100)
Amylase: 148 U/L — ABNORMAL HIGH (ref 28–100)
Amylase: 168 U/L — ABNORMAL HIGH (ref 28–100)

## 2022-02-21 LAB — PHOSPHORUS
Phosphorus: 2 mg/dL — ABNORMAL LOW (ref 2.5–4.6)
Phosphorus: 1.7 mg/dL — ABNORMAL LOW (ref 2.5–4.6)
Phosphorus: 2.6 mg/dL (ref 2.5–4.6)
Phosphorus: 2.2 mg/dL — ABNORMAL LOW (ref 2.5–4.6)

## 2022-02-21 LAB — LACTIC ACID, PLASMA: Lactic Acid, Venous: 1.2 mmol/L (ref 0.5–1.9)

## 2022-02-21 LAB — BILIRUBIN, DIRECT
Bilirubin, Direct: 1.8 mg/dL — ABNORMAL HIGH (ref 0.0–0.2)
Bilirubin, Direct: 1.7 mg/dL — ABNORMAL HIGH (ref 0.0–0.2)
Bilirubin, Direct: 1.7 mg/dL — ABNORMAL HIGH (ref 0.0–0.2)
Bilirubin, Direct: 1.6 mg/dL — ABNORMAL HIGH (ref 0.0–0.2)

## 2022-02-21 LAB — LACTATE DEHYDROGENASE: LDH: 451 U/L — ABNORMAL HIGH (ref 98–192)

## 2022-02-21 LAB — CULTURE, RESPIRATORY W GRAM STAIN

## 2022-02-21 LAB — APTT: aPTT: 46 seconds — ABNORMAL HIGH (ref 24–36)

## 2022-02-21 LAB — FIBRINOGEN: Fibrinogen: 762 mg/dL — ABNORMAL HIGH (ref 210–475)

## 2022-02-21 MED ORDER — POTASSIUM CHLORIDE 20 MEQ PO PACK
40.0000 meq | PACK | Freq: Once | ORAL | Status: AC
  Administered 2022-02-21: 40 meq
  Filled 2022-02-21: qty 2

## 2022-02-21 MED ORDER — ORAL CARE MOUTH RINSE
15.0000 mL | OROMUCOSAL | Status: DC
  Administered 2022-02-21 – 2022-02-22 (×17): 15 mL via OROMUCOSAL

## 2022-02-21 MED ORDER — ORAL CARE MOUTH RINSE: 15.0000 mL | OROMUCOSAL | Status: DC | PRN

## 2022-02-21 MED ORDER — POTASSIUM PHOSPHATES 15 MMOLE/5ML IV SOLN
15.0000 mmol | Freq: Once | INTRAVENOUS | Status: AC
  Administered 2022-02-21: 15 mmol via INTRAVENOUS
  Filled 2022-02-21: qty 5
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
## 2022-02-21 NOTE — Progress Notes (Signed)
  Echocardiogram 2D Echocardiogram has been performed.  Steven Adams 02/21/2022, 8:46 AM

## 2022-02-21 NOTE — Progress Notes (Signed)
Brief Nutrition Note:   Pt remains on vent support, on ECMO. MD notes reviewed and noted plan for organ donation.   Currently receiving trickle TF with Protein Modular for additional protein. Plan to continue current nutrition regimen for now. Please re-consult RD if change in poc.   Romelle Starcher MS, RDN, LDN, CNSC Registered Dietitian 3 Clinical Nutrition RD Pager and On-Call Pager Number Located in [place]

## 2022-02-22 ENCOUNTER — Inpatient Hospital Stay (HOSPITAL_COMMUNITY): Payer: Self-pay | Admitting: Anesthesiology

## 2022-02-22 ENCOUNTER — Encounter (HOSPITAL_COMMUNITY): Admission: EM | Disposition: E | Payer: Self-pay | Source: Home / Self Care | Attending: Pulmonary Disease

## 2022-02-22 ENCOUNTER — Inpatient Hospital Stay (HOSPITAL_COMMUNITY): Payer: Self-pay

## 2022-02-22 DIAGNOSIS — G9382 Brain death: Secondary | ICD-10-CM

## 2022-02-22 HISTORY — PX: ORGAN PROCUREMENT: SHX5270

## 2022-02-22 LAB — POCT I-STAT 7, (LYTES, BLD GAS, ICA,H+H)
Acid-Base Excess: 4 mmol/L — ABNORMAL HIGH (ref 0.0–2.0)
Acid-Base Excess: 4 mmol/L — ABNORMAL HIGH (ref 0.0–2.0)
Acid-Base Excess: 5 mmol/L — ABNORMAL HIGH (ref 0.0–2.0)
Acid-Base Excess: 5 mmol/L — ABNORMAL HIGH (ref 0.0–2.0)
Bicarbonate: 30.8 mmol/L — ABNORMAL HIGH (ref 20.0–28.0)
Bicarbonate: 31.3 mmol/L — ABNORMAL HIGH (ref 20.0–28.0)
Bicarbonate: 31.9 mmol/L — ABNORMAL HIGH (ref 20.0–28.0)
Bicarbonate: 32.3 mmol/L — ABNORMAL HIGH (ref 20.0–28.0)
Calcium, Ion: 1.31 mmol/L (ref 1.15–1.40)
Calcium, Ion: 1.34 mmol/L (ref 1.15–1.40)
Calcium, Ion: 1.32 mmol/L (ref 1.15–1.40)
Calcium, Ion: 1.33 mmol/L (ref 1.15–1.40)
HCT: 35 % — ABNORMAL LOW (ref 39.0–52.0)
HCT: 33 % — ABNORMAL LOW (ref 39.0–52.0)
HCT: 33 % — ABNORMAL LOW (ref 39.0–52.0)
HCT: 33 % — ABNORMAL LOW (ref 39.0–52.0)
Hemoglobin: 11.9 g/dL — ABNORMAL LOW (ref 13.0–17.0)
Hemoglobin: 11.2 g/dL — ABNORMAL LOW (ref 13.0–17.0)
Hemoglobin: 11.2 g/dL — ABNORMAL LOW (ref 13.0–17.0)
Hemoglobin: 11.2 g/dL — ABNORMAL LOW (ref 13.0–17.0)
O2 Saturation: 96 %
O2 Saturation: 98 %
O2 Saturation: 99 %
O2 Saturation: 99 %
Patient temperature: 36.6
Patient temperature: 36.7
Patient temperature: 36.8
Patient temperature: 36.9
Potassium: 3.5 mmol/L (ref 3.5–5.1)
Potassium: 3.4 mmol/L — ABNORMAL LOW (ref 3.5–5.1)
Potassium: 3.7 mmol/L (ref 3.5–5.1)
Potassium: 3.7 mmol/L (ref 3.5–5.1)
Sodium: 150 mmol/L — ABNORMAL HIGH (ref 135–145)
Sodium: 150 mmol/L — ABNORMAL HIGH (ref 135–145)
Sodium: 150 mmol/L — ABNORMAL HIGH (ref 135–145)
Sodium: 149 mmol/L — ABNORMAL HIGH (ref 135–145)
TCO2: 32 mmol/L (ref 22–32)
TCO2: 33 mmol/L — ABNORMAL HIGH (ref 22–32)
TCO2: 34 mmol/L — ABNORMAL HIGH (ref 22–32)
TCO2: 34 mmol/L — ABNORMAL HIGH (ref 22–32)
pCO2 arterial: 52.3 mmHg — ABNORMAL HIGH (ref 32–48)
pCO2 arterial: 57 mmHg — ABNORMAL HIGH (ref 32–48)
pCO2 arterial: 57.3 mmHg — ABNORMAL HIGH (ref 32–48)
pCO2 arterial: 59.2 mmHg — ABNORMAL HIGH (ref 32–48)
pH, Arterial: 7.377 (ref 7.35–7.45)
pH, Arterial: 7.346 — ABNORMAL LOW (ref 7.35–7.45)
pH, Arterial: 7.352 (ref 7.35–7.45)
pH, Arterial: 7.344 — ABNORMAL LOW (ref 7.35–7.45)
pO2, Arterial: 86 mmHg (ref 83–108)
pO2, Arterial: 105 mmHg (ref 83–108)
pO2, Arterial: 170 mmHg — ABNORMAL HIGH (ref 83–108)
pO2, Arterial: 134 mmHg — ABNORMAL HIGH (ref 83–108)

## 2022-02-22 LAB — BPAM RBC
Blood Product Expiration Date: 202401132359
Blood Product Expiration Date: 202401132359
Blood Product Expiration Date: 202401132359
Blood Product Expiration Date: 202401152359
Blood Product Expiration Date: 202401152359
Blood Product Expiration Date: 202401152359
Blood Product Expiration Date: 202401152359
Blood Product Expiration Date: 202401162359
Blood Product Expiration Date: 202401172359
Blood Product Expiration Date: 202401182359
Blood Product Expiration Date: 202401182359
Blood Product Expiration Date: 202401182359
Blood Product Expiration Date: 202401182359
Blood Product Expiration Date: 202401192359
Blood Product Expiration Date: 202401192359
Blood Product Expiration Date: 202401192359
Blood Product Expiration Date: 202401192359
Blood Product Expiration Date: 202401192359
Blood Product Expiration Date: 202401192359
Blood Product Expiration Date: 202401192359
Blood Product Expiration Date: 202401192359
Blood Product Expiration Date: 202401192359
Blood Product Expiration Date: 202401192359
Blood Product Expiration Date: 202401192359
Blood Product Expiration Date: 202401192359
Blood Product Expiration Date: 202401212359
Blood Product Expiration Date: 202401212359
Blood Product Expiration Date: 202401242359
Blood Product Expiration Date: 202401242359
Blood Product Expiration Date: 202401242359
Blood Product Expiration Date: 202401242359
Blood Product Expiration Date: 202401242359
Blood Product Expiration Date: 202401242359
Blood Product Expiration Date: 202401242359
ISSUE DATE / TIME: 202312241732
ISSUE DATE / TIME: 202312241732
ISSUE DATE / TIME: 202312241735
ISSUE DATE / TIME: 202312241735
ISSUE DATE / TIME: 202312241735
ISSUE DATE / TIME: 202312241735
ISSUE DATE / TIME: 202312241735
ISSUE DATE / TIME: 202312241735
ISSUE DATE / TIME: 202312241735
ISSUE DATE / TIME: 202312241735
ISSUE DATE / TIME: 202312241746
ISSUE DATE / TIME: 202312241746
ISSUE DATE / TIME: 202312242101
ISSUE DATE / TIME: 202312242114
ISSUE DATE / TIME: 202312242357
ISSUE DATE / TIME: 202312250149
ISSUE DATE / TIME: 202312250149
ISSUE DATE / TIME: 202312250406
ISSUE DATE / TIME: 202312250406
ISSUE DATE / TIME: 202312250418
ISSUE DATE / TIME: 202312250418
ISSUE DATE / TIME: 202312250418
ISSUE DATE / TIME: 202312250418
ISSUE DATE / TIME: 202312250709
ISSUE DATE / TIME: 202312250940
ISSUE DATE / TIME: 202312251211
ISSUE DATE / TIME: 202312251244
ISSUE DATE / TIME: 202312251444
ISSUE DATE / TIME: 202312251520
ISSUE DATE / TIME: 202312251658
ISSUE DATE / TIME: 202312261852
ISSUE DATE / TIME: 202312261852
ISSUE DATE / TIME: 202312280257
ISSUE DATE / TIME: 202312280547
Unit Type and Rh: 5100
Unit Type and Rh: 5100
Unit Type and Rh: 5100
Unit Type and Rh: 5100
Unit Type and Rh: 5100
Unit Type and Rh: 5100
Unit Type and Rh: 5100
Unit Type and Rh: 5100
Unit Type and Rh: 5100
Unit Type and Rh: 5100
Unit Type and Rh: 5100
Unit Type and Rh: 5100
Unit Type and Rh: 5100
Unit Type and Rh: 5100
Unit Type and Rh: 5100
Unit Type and Rh: 5100
Unit Type and Rh: 5100
Unit Type and Rh: 5100
Unit Type and Rh: 5100
Unit Type and Rh: 5100
Unit Type and Rh: 5100
Unit Type and Rh: 5100
Unit Type and Rh: 5100
Unit Type and Rh: 5100
Unit Type and Rh: 5100
Unit Type and Rh: 5100
Unit Type and Rh: 5100
Unit Type and Rh: 5100
Unit Type and Rh: 5100
Unit Type and Rh: 5100
Unit Type and Rh: 5100
Unit Type and Rh: 5100
Unit Type and Rh: 5100
Unit Type and Rh: 5100

## 2022-02-22 LAB — TYPE AND SCREEN
ABO/RH(D): O POS
Antibody Screen: NEGATIVE
Unit division: 0
Unit division: 0
Unit division: 0
Unit division: 0
Unit division: 0
Unit division: 0
Unit division: 0
Unit division: 0
Unit division: 0
Unit division: 0
Unit division: 0
Unit division: 0
Unit division: 0
Unit division: 0
Unit division: 0
Unit division: 0
Unit division: 0
Unit division: 0
Unit division: 0
Unit division: 0
Unit division: 0
Unit division: 0
Unit division: 0
Unit division: 0
Unit division: 0
Unit division: 0
Unit division: 0
Unit division: 0
Unit division: 0
Unit division: 0
Unit division: 0
Unit division: 0
Unit division: 0
Unit division: 0

## 2022-02-22 LAB — COMPREHENSIVE METABOLIC PANEL
ALT: 32 U/L (ref 0–44)
ALT: 29 U/L (ref 0–44)
ALT: 28 U/L (ref 0–44)
AST: 52 U/L — ABNORMAL HIGH (ref 15–41)
AST: 48 U/L — ABNORMAL HIGH (ref 15–41)
AST: 45 U/L — ABNORMAL HIGH (ref 15–41)
Albumin: 2.5 g/dL — ABNORMAL LOW (ref 3.5–5.0)
Albumin: 2.4 g/dL — ABNORMAL LOW (ref 3.5–5.0)
Albumin: 2.2 g/dL — ABNORMAL LOW (ref 3.5–5.0)
Alkaline Phosphatase: 55 U/L (ref 38–126)
Alkaline Phosphatase: 53 U/L (ref 38–126)
Alkaline Phosphatase: 53 U/L (ref 38–126)
Anion gap: 3 — ABNORMAL LOW (ref 5–15)
Anion gap: 5 (ref 5–15)
Anion gap: 3 — ABNORMAL LOW (ref 5–15)
BUN: 26 mg/dL — ABNORMAL HIGH (ref 6–20)
BUN: 29 mg/dL — ABNORMAL HIGH (ref 6–20)
BUN: 30 mg/dL — ABNORMAL HIGH (ref 6–20)
CO2: 31 mmol/L (ref 22–32)
CO2: 30 mmol/L (ref 22–32)
CO2: 31 mmol/L (ref 22–32)
Calcium: 8.7 mg/dL — ABNORMAL LOW (ref 8.9–10.3)
Calcium: 8.6 mg/dL — ABNORMAL LOW (ref 8.9–10.3)
Calcium: 8.5 mg/dL — ABNORMAL LOW (ref 8.9–10.3)
Chloride: 117 mmol/L — ABNORMAL HIGH (ref 98–111)
Chloride: 116 mmol/L — ABNORMAL HIGH (ref 98–111)
Chloride: 116 mmol/L — ABNORMAL HIGH (ref 98–111)
Creatinine, Ser: 0.86 mg/dL (ref 0.61–1.24)
Creatinine, Ser: 0.78 mg/dL (ref 0.61–1.24)
Creatinine, Ser: 0.82 mg/dL (ref 0.61–1.24)
GFR, Estimated: >60 mL/min (ref >60)
GFR, Estimated: >60 mL/min (ref >60)
GFR, Estimated: >60 mL/min (ref >60)
Glucose, Bld: 96 mg/dL (ref 70–99)
Glucose, Bld: 97 mg/dL (ref 70–99)
Glucose, Bld: 95 mg/dL (ref 70–99)
Potassium: 3.6 mmol/L (ref 3.5–5.1)
Potassium: 3.6 mmol/L (ref 3.5–5.1)
Potassium: 3.8 mmol/L (ref 3.5–5.1)
Sodium: 151 mmol/L — ABNORMAL HIGH (ref 135–145)
Sodium: 151 mmol/L — ABNORMAL HIGH (ref 135–145)
Sodium: 150 mmol/L — ABNORMAL HIGH (ref 135–145)
Total Bilirubin: 3.8 mg/dL — ABNORMAL HIGH (ref 0.3–1.2)
Total Bilirubin: 3.3 mg/dL — ABNORMAL HIGH (ref 0.3–1.2)
Total Bilirubin: 3.3 mg/dL — ABNORMAL HIGH (ref 0.3–1.2)
Total Protein: 5.1 g/dL — ABNORMAL LOW (ref 6.5–8.1)
Total Protein: 4.7 g/dL — ABNORMAL LOW (ref 6.5–8.1)
Total Protein: 4.8 g/dL — ABNORMAL LOW (ref 6.5–8.1)

## 2022-02-22 LAB — CBC
HCT: 39.6 % (ref 39.0–52.0)
HCT: 37.7 % — ABNORMAL LOW (ref 39.0–52.0)
HCT: 37.5 % — ABNORMAL LOW (ref 39.0–52.0)
Hemoglobin: 12.7 g/dL — ABNORMAL LOW (ref 13.0–17.0)
Hemoglobin: 12.2 g/dL — ABNORMAL LOW (ref 13.0–17.0)
Hemoglobin: 12.1 g/dL — ABNORMAL LOW (ref 13.0–17.0)
MCH: 30 pg (ref 26.0–34.0)
MCH: 30.3 pg (ref 26.0–34.0)
MCH: 30.4 pg (ref 26.0–34.0)
MCHC: 32.1 g/dL (ref 30.0–36.0)
MCHC: 32.4 g/dL (ref 30.0–36.0)
MCHC: 32.3 g/dL (ref 30.0–36.0)
MCV: 93.6 fL (ref 80.0–100.0)
MCV: 93.5 fL (ref 80.0–100.0)
MCV: 94.2 fL (ref 80.0–100.0)
Platelets: 34 10*3/uL — ABNORMAL LOW (ref 150–400)
Platelets: 34 10*3/uL — ABNORMAL LOW (ref 150–400)
Platelets: 34 10*3/uL — ABNORMAL LOW (ref 150–400)
RBC: 4.23 MIL/uL (ref 4.22–5.81)
RBC: 4.03 MIL/uL — ABNORMAL LOW (ref 4.22–5.81)
RBC: 3.98 MIL/uL — ABNORMAL LOW (ref 4.22–5.81)
RDW: 17 % — ABNORMAL HIGH (ref 11.5–15.5)
RDW: 16.9 % — ABNORMAL HIGH (ref 11.5–15.5)
RDW: 17 % — ABNORMAL HIGH (ref 11.5–15.5)
WBC: 10 10*3/uL (ref 4.0–10.5)
WBC: 9.4 10*3/uL (ref 4.0–10.5)
WBC: 8.5 10*3/uL (ref 4.0–10.5)
nRBC: 0.3 % — ABNORMAL HIGH (ref 0.0–0.2)
nRBC: 0.5 % — ABNORMAL HIGH (ref 0.0–0.2)
nRBC: 0.6 % — ABNORMAL HIGH (ref 0.0–0.2)

## 2022-02-22 LAB — GLUCOSE, CAPILLARY
Glucose-Capillary: 102 mg/dL — ABNORMAL HIGH (ref 70–99)
Glucose-Capillary: 103 mg/dL — ABNORMAL HIGH (ref 70–99)
Glucose-Capillary: 83 mg/dL (ref 70–99)
Glucose-Capillary: 91 mg/dL (ref 70–99)

## 2022-02-22 LAB — CK TOTAL AND CKMB (NOT AT ARMC)
CK, MB: 1.8 ng/mL (ref 0.5–5.0)
CK, MB: 1 ng/mL (ref 0.5–5.0)
CK, MB: 1.3 ng/mL (ref 0.5–5.0)
Relative Index: 0.4 (ref 0.0–2.5)
Relative Index: 0.3 (ref 0.0–2.5)
Relative Index: 0.4 (ref 0.0–2.5)
Total CK: 421 U/L — ABNORMAL HIGH (ref 49–397)
Total CK: 392 U/L (ref 49–397)
Total CK: 346 U/L (ref 49–397)

## 2022-02-22 LAB — URINALYSIS, ROUTINE W REFLEX MICROSCOPIC
Bacteria, UA: NONE SEEN
Bilirubin Urine: NEGATIVE
Glucose, UA: NEGATIVE mg/dL
Ketones, ur: NEGATIVE mg/dL
Leukocytes,Ua: NEGATIVE
Nitrite: NEGATIVE
Protein, ur: 30 mg/dL — AB
Specific Gravity, Urine: 1.017 (ref 1.005–1.030)
pH: 7 (ref 5.0–8.0)

## 2022-02-22 LAB — HEMOGLOBIN A1C
Hgb A1c MFr Bld: 5.5 % (ref 4.8–5.6)
Mean Plasma Glucose: 111 mg/dL

## 2022-02-22 LAB — PROTIME-INR
INR: 1.1 (ref 0.8–1.2)
INR: 1.2 (ref 0.8–1.2)
INR: 1.1 (ref 0.8–1.2)
Prothrombin Time: 14.2 seconds (ref 11.4–15.2)
Prothrombin Time: 14.6 seconds (ref 11.4–15.2)
Prothrombin Time: 14.4 seconds (ref 11.4–15.2)

## 2022-02-22 LAB — AMYLASE
Amylase: 231 U/L — ABNORMAL HIGH (ref 28–100)
Amylase: 228 U/L — ABNORMAL HIGH (ref 28–100)
Amylase: 299 U/L — ABNORMAL HIGH (ref 28–100)

## 2022-02-22 LAB — LIPASE, BLOOD
Lipase: 90 U/L — ABNORMAL HIGH (ref 11–51)
Lipase: 93 U/L — ABNORMAL HIGH (ref 11–51)
Lipase: 123 U/L — ABNORMAL HIGH (ref 11–51)

## 2022-02-22 LAB — MAGNESIUM
Magnesium: 2.2 mg/dL (ref 1.7–2.4)
Magnesium: 2.2 mg/dL (ref 1.7–2.4)
Magnesium: 2.1 mg/dL (ref 1.7–2.4)

## 2022-02-22 LAB — PREPARE RBC (CROSSMATCH)

## 2022-02-22 LAB — BILIRUBIN, DIRECT
Bilirubin, Direct: 1.8 mg/dL — ABNORMAL HIGH (ref 0.0–0.2)
Bilirubin, Direct: 1.6 mg/dL — ABNORMAL HIGH (ref 0.0–0.2)
Bilirubin, Direct: 1.6 mg/dL — ABNORMAL HIGH (ref 0.0–0.2)

## 2022-02-22 LAB — FIBRINOGEN: Fibrinogen: 759 mg/dL — ABNORMAL HIGH (ref 210–475)

## 2022-02-22 LAB — VANCOMYCIN, TROUGH: Vancomycin Tr: 10 ug/mL — ABNORMAL LOW (ref 15–20)

## 2022-02-22 LAB — PHOSPHORUS
Phosphorus: 1.7 mg/dL — ABNORMAL LOW (ref 2.5–4.6)
Phosphorus: 1.6 mg/dL — ABNORMAL LOW (ref 2.5–4.6)
Phosphorus: 2.8 mg/dL (ref 2.5–4.6)

## 2022-02-22 LAB — LACTIC ACID, PLASMA: Lactic Acid, Venous: 0.9 mmol/L (ref 0.5–1.9)

## 2022-02-22 LAB — LACTATE DEHYDROGENASE: LDH: 465 U/L — ABNORMAL HIGH (ref 98–192)

## 2022-02-22 LAB — APTT: aPTT: 40 seconds — ABNORMAL HIGH (ref 24–36)

## 2022-02-22 SURGERY — SURGICAL PROCUREMENT, ORGAN
Anesthesia: General

## 2022-02-22 MED ORDER — FENTANYL 2500MCG IN NS 250ML (10MCG/ML) PREMIX INFUSION
0.0000 ug/h | INTRAVENOUS | Status: DC
  Administered 2022-02-22: 400 ug/h via INTRAVENOUS
  Filled 2022-02-22 (×2): qty 250

## 2022-02-22 MED ORDER — MIDAZOLAM-SODIUM CHLORIDE 100-0.9 MG/100ML-% IV SOLN
INTRAVENOUS | Status: AC
  Filled 2022-02-22: qty 100

## 2022-02-22 MED ORDER — VANCOMYCIN HCL 1250 MG/250ML IV SOLN
1250.0000 mg | Freq: Two times a day (BID) | INTRAVENOUS | Status: DC
  Administered 2022-02-22 (×2): 1250 mg via INTRAVENOUS
  Filled 2022-02-22 (×3): qty 250

## 2022-02-22 MED ORDER — HEPARIN SODIUM 10000 UNIT/ML FOR ORGAN DONATION
300.0000 [IU]/kg | INTRAMUSCULAR | Status: DC
  Filled 2022-02-22 (×2): qty 3

## 2022-02-22 MED ORDER — SODIUM BICARBONATE 8.4 % IJ SOLN
INTRAMUSCULAR | Status: DC
  Filled 2022-02-22: qty 6

## 2022-02-22 MED ORDER — GLYCOPYRROLATE 1 MG PO TABS: 1.0000 mg | ORAL_TABLET | ORAL | Status: DC | PRN

## 2022-02-22 MED ORDER — CALCIUM GLUCONATE 10 % IV SOLN
1.0000 g | INTRAVENOUS | Status: DC
  Filled 2022-02-22: qty 10

## 2022-02-22 MED ORDER — GLYCOPYRROLATE 0.2 MG/ML IJ SOLN: 0.2000 mg | INTRAMUSCULAR | Status: DC | PRN

## 2022-02-22 MED ORDER — POTASSIUM PHOSPHATES 15 MMOLE/5ML IV SOLN
20.0000 mmol | Freq: Once | INTRAVENOUS | Status: AC
  Administered 2022-02-22: 20 mmol via INTRAVENOUS
  Filled 2022-02-22 (×2): qty 6.67

## 2022-02-22 MED ORDER — MIDAZOLAM BOLUS VIA INFUSION: 4.0000 mg | INTRAVENOUS | Status: DC | PRN

## 2022-02-22 MED ORDER — SODIUM CHLORIDE 0.9 % IV SOLN: INTRAVENOUS | Status: DC

## 2022-02-22 MED ORDER — VASOPRESSIN 20 UNIT/ML IV SOLN
40.0000 [IU] | INTRAVENOUS | Status: DC
  Filled 2022-02-22: qty 2

## 2022-02-22 MED ORDER — SODIUM CHLORIDE 0.9% IV SOLUTION: Freq: Once | INTRAVENOUS | Status: DC

## 2022-02-22 MED ORDER — FREE WATER
200.0000 mL | Status: DC
  Administered 2022-02-22 (×3): 200 mL

## 2022-02-22 MED ORDER — PLASMA-LYTE 148 IV SOLN: INTRAVENOUS | Status: DC

## 2022-02-22 MED ORDER — POLYVINYL ALCOHOL 1.4 % OP SOLN: 1.0000 [drp] | Freq: Four times a day (QID) | OPHTHALMIC | Status: DC | PRN

## 2022-02-22 MED ORDER — ACETAMINOPHEN 325 MG PO TABS: 650.0000 mg | ORAL_TABLET | Freq: Four times a day (QID) | ORAL | Status: DC | PRN

## 2022-02-22 MED ORDER — MIDAZOLAM-SODIUM CHLORIDE 100-0.9 MG/100ML-% IV SOLN
0.5000 mg/h | INTRAVENOUS | Status: AC
  Administered 2022-02-22: 2 mg/h via INTRAVENOUS

## 2022-02-22 MED ORDER — FENTANYL BOLUS VIA INFUSION
100.0000 ug | INTRAVENOUS | Status: DC | PRN
  Administered 2022-02-22: 100 ug via INTRAVENOUS

## 2022-02-22 MED ORDER — MIDAZOLAM-SODIUM CHLORIDE 100-0.9 MG/100ML-% IV SOLN
0.5000 mg/h | INTRAVENOUS | Status: DC
  Administered 2022-02-22: 2 mg/h via INTRAVENOUS
  Filled 2022-02-22: qty 100

## 2022-02-22 MED ORDER — ACETAMINOPHEN 650 MG RE SUPP: 650.0000 mg | Freq: Four times a day (QID) | RECTAL | Status: DC | PRN

## 2022-02-22 MED ORDER — HEPARIN SODIUM 10000 UNIT/ML FOR ORGAN DONATION
50000.0000 [IU] | Freq: Once | INTRAMUSCULAR | Status: DC
  Filled 2022-02-22: qty 5

## 2022-02-22 MED ORDER — 0.9 % SODIUM CHLORIDE (POUR BTL) OPTIME
TOPICAL | Status: DC | PRN
  Administered 2022-02-22: 1000 mL
  Administered 2022-02-22: 3000 mL
  Administered 2022-02-22: 5000 mL
  Administered 2022-02-23 (×2): 1000 mL
  Administered 2022-02-23 (×2): 2000 mL

## 2022-02-22 SURGICAL SUPPLY — 69 items
APPLIER CLIP 11 MED OPEN (CLIP) ×1
APR CLP MED 11 20 MLT OPN (CLIP) ×1
BAG COUNTER SPONGE SURGICOUNT (BAG) ×1 IMPLANT
BAG SPNG CNTER NS LX DISP (BAG) ×1
BLADE CLIPPER SURG (BLADE) IMPLANT
BLADE SAW STERNAL (BLADE) ×1 IMPLANT
BLADE STERNUM SYSTEM 6 (BLADE) IMPLANT
BLADE SURG 10 STRL SS (BLADE) IMPLANT
CANNULA NON VENT 22FR 12 (CANNULA) IMPLANT
CANNULA SUMP PERICARDIAL (CANNULA) IMPLANT
CANNULA VENOUS MALLSGL STG 38 (MISCELLANEOUS) IMPLANT
CANNULAE VENOUS MALLSGL STG 38 (MISCELLANEOUS) ×1
CATH ROBINSON RED A/P 18FR (CATHETERS) IMPLANT
CLIP APPLIE 11 MED OPEN (CLIP) ×1 IMPLANT
CNTNR URN SCR LID CUP LEK RST (MISCELLANEOUS) ×1 IMPLANT
CONN ST 1/2X1/2  BEN (MISCELLANEOUS) ×1
CONN ST 1/2X1/2 BEN (MISCELLANEOUS) IMPLANT
CONT SPEC 4OZ STRL OR WHT (MISCELLANEOUS) ×1
COVER BACK TABLE 60X90IN (DRAPES) IMPLANT
COVER MAYO STAND STRL (DRAPES) IMPLANT
COVER SURGICAL LIGHT HANDLE (MISCELLANEOUS) ×1 IMPLANT
DRAPE HALF SHEET 40X57 (DRAPES) IMPLANT
DRAPE SLUSH MACHINE 52X66 (DRAPES) ×1 IMPLANT
DRSG COVADERM 4X10 (GAUZE/BANDAGES/DRESSINGS) IMPLANT
DRSG TELFA 3X8 NADH STRL (GAUZE/BANDAGES/DRESSINGS) ×1 IMPLANT
ELECT BLADE 4.0 EZ CLEAN MEGAD (MISCELLANEOUS) ×2
ELECT BLADE 6.5 EXT (BLADE) IMPLANT
ELECT REM PT RETURN 9FT ADLT (ELECTROSURGICAL) ×2
ELECTRODE BLDE 4.0 EZ CLN MEGD (MISCELLANEOUS) IMPLANT
ELECTRODE REM PT RTRN 9FT ADLT (ELECTROSURGICAL) ×2 IMPLANT
GLOVE BIO SURGEON STRL SZ 6.5 (GLOVE) IMPLANT
GLOVE BIO SURGEON STRL SZ7 (GLOVE) IMPLANT
GLOVE BIOGEL PI IND STRL 6.5 (GLOVE) IMPLANT
GLOVE SS N UNI LF 7.0 STRL (GLOVE) IMPLANT
GOWN STRL REUS W/ TWL LRG LVL3 (GOWN DISPOSABLE) ×4 IMPLANT
GOWN STRL REUS W/ TWL XL LVL3 (GOWN DISPOSABLE) ×2 IMPLANT
GOWN STRL REUS W/TWL LRG LVL3 (GOWN DISPOSABLE) ×4
GOWN STRL REUS W/TWL XL LVL3 (GOWN DISPOSABLE) ×2
HANDLE SUCTION POOLE (INSTRUMENTS) IMPLANT
INSERT FOGARTY 61MM (MISCELLANEOUS) IMPLANT
KIT POST MORTEM ADULT 36X90 (BAG) ×1 IMPLANT
KIT TURNOVER KIT B (KITS) ×1 IMPLANT
MANIFOLD NEPTUNE II (INSTRUMENTS) ×1 IMPLANT
NS IRRIG 1000ML POUR BTL (IV SOLUTION) IMPLANT
PACK AORTA (CUSTOM PROCEDURE TRAY) ×1 IMPLANT
PAD ARMBOARD 7.5X6 YLW CONV (MISCELLANEOUS) ×2 IMPLANT
PENCIL BUTTON HOLSTER BLD 10FT (ELECTRODE) ×1 IMPLANT
SHEATH PROBE COVER 6X72 (BAG) IMPLANT
SOL PREP POV-IOD 4OZ 10% (MISCELLANEOUS) ×2 IMPLANT
STAPLER VISISTAT 35W (STAPLE) ×1 IMPLANT
SUCTION POOLE HANDLE (INSTRUMENTS) ×1
SUT ETHILON 1 LR 30 (SUTURE) ×2 IMPLANT
SUT ETHILON 2 LR (SUTURE) IMPLANT
SUT PROLENE 2 0 SH DA (SUTURE) IMPLANT
SUT PROLENE 4 0 RB 1 (SUTURE) ×2
SUT PROLENE 4 0 SH DA (SUTURE) IMPLANT
SUT PROLENE 4-0 RB1 18X2 ARM (SUTURE) IMPLANT
SUT PROLENE 7 0 BV 1 (SUTURE) IMPLANT
SUT SILK 2 0 (SUTURE) ×3
SUT SILK 2 0 SH CR/8 (SUTURE) IMPLANT
SUT SILK 2 0 TIES 10X30 (SUTURE) IMPLANT
SUT SILK 2-0 18XBRD TIE 12 (SUTURE) IMPLANT
SUT SILK 3 0 (SUTURE) ×3
SUT SILK 3 0 SH CR/8 (SUTURE) IMPLANT
SUT SILK 3 0 TIES 10X30 (SUTURE) IMPLANT
SUT SILK 3-0 18XBRD TIE 12 (SUTURE) IMPLANT
TUBE CONNECTING 12X1/4 (SUCTIONS) ×1 IMPLANT
TUBE NG 5FR 35IN ENFIT (TUBING) IMPLANT
YANKAUER SUCT BULB TIP NO VENT (SUCTIONS) ×1 IMPLANT
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
## 2022-02-22 NOTE — Progress Notes (Signed)
ECMO circuit clamped at 2312 per [facility] order protocol.
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
## 2022-02-22 NOTE — OR Nursing (Signed)
Time of death: 2335/06/11

## 2022-02-22 NOTE — OR Nursing (Signed)
Extubated patient

## 2022-02-22 NOTE — Anesthesia Preprocedure Evaluation (Addendum)
Anesthesia Evaluation  Patient identified by MRN, date of birth, ID band Patient unresponsive    Reviewed: Patient's Chart, lab work & pertinent test results  Airway Mallampati: Intubated       Dental   Pulmonary asthma     + decreased breath sounds      Cardiovascular Normal cardiovascular exam  DCD on VV ECMO   Neuro/Psych  negative psych ROS   GI/Hepatic negative GI ROS, Neg liver ROS,,,  Endo/Other  negative endocrine ROS    Renal/GU negative Renal ROS  negative genitourinary   Musculoskeletal negative musculoskeletal ROS (+)    Abdominal Normal abdominal exam  (+)   Peds  Hematology  (+) Blood dyscrasia, anemia   Anesthesia Other Findings   Reproductive/Obstetrics                             Anesthesia Physical Anesthesia Plan  ASA: 6  Anesthesia Plan: General   Post-op Pain Management:    Induction:   PONV Risk Score and Plan: 2 and Treatment may vary due to age or medical condition  Airway Management Planned: Oral ETT  Additional Equipment: Arterial line and CVP  Intra-op Plan:   Post-operative Plan:   Informed Consent:   Plan Discussed with:   Anesthesia Plan Comments:        Anesthesia Quick Evaluation
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
## 2022-02-22 NOTE — Procedures (Signed)
Extubation Procedure Note  Patient Details:   Name: Steven Adams DOB: 2000-03-26 MRN: [id number]   Airway Documentation:    Vent end date: 02/22/22 Vent end time: 2312   Evaluation  O2 sats: stable throughout Complications: No apparent complications Patient did tolerate procedure well.    Patient was extubated in the Operating Room, per honor-bridge request/order protocols. Patient tolerated the procedure well. No apparent complications noted.   Benjamine Sprague, BS, RRT-ACCS, RCP 02/22/2022, 11:49 PM

## 2022-02-23 LAB — TYPE AND SCREEN
ABO/RH(D): O POS
Antibody Screen: NEGATIVE
Unit division: 0
Unit division: 0
Unit division: 0
Unit division: 0
Unit division: 0
Unit division: 0
Unit division: 0
Unit division: 0
Unit division: 0
Unit division: 0
Unit division: 0

## 2022-02-23 LAB — BPAM RBC
Blood Product Expiration Date: 202401252359
Blood Product Expiration Date: 202401262359
Blood Product Expiration Date: 202401262359
Blood Product Expiration Date: 202401262359
Blood Product Expiration Date: 202401262359
Blood Product Expiration Date: 202401262359
Blood Product Expiration Date: 202401262359
Blood Product Expiration Date: 202401262359
Blood Product Expiration Date: 202401262359
Blood Product Expiration Date: 202401262359
Blood Product Expiration Date: 202401282359
ISSUE DATE / TIME: 202312292037
ISSUE DATE / TIME: 202312292037
ISSUE DATE / TIME: 202312292037
ISSUE DATE / TIME: 202312292037
ISSUE DATE / TIME: 202312292148
ISSUE DATE / TIME: 202312292148
ISSUE DATE / TIME: 202312292148
ISSUE DATE / TIME: 202312292148
ISSUE DATE / TIME: 202312292148
ISSUE DATE / TIME: 202312292148
ISSUE DATE / TIME: 202312292148
Unit Type and Rh: 5100
Unit Type and Rh: 5100
Unit Type and Rh: 5100
Unit Type and Rh: 5100
Unit Type and Rh: 5100
Unit Type and Rh: 5100
Unit Type and Rh: 5100
Unit Type and Rh: 5100
Unit Type and Rh: 5100
Unit Type and Rh: 5100
Unit Type and Rh: 5100

## 2022-02-23 LAB — GLUCOSE, CAPILLARY: Glucose-Capillary: 89 mg/dL (ref 70–99)

## 2022-02-23 NOTE — Anesthesia Procedure Notes (Signed)
Procedure Name: Intubation Date/Time: 02/23/2022 12:02 AM  Performed by: Zenda Herskowitz T, CRNAPre-anesthesia Checklist: Patient identified, Emergency Drugs available, Suction available and Patient being monitored Patient Re-evaluated:Patient Re-evaluated prior to induction Oxygen Delivery Method: Circle system utilized Ventilation: Mask ventilation without difficulty Laryngoscope Size: Glidescope and 3 Grade View: Grade III Tube type: Oral Tube size: 8.0 mm Number of attempts: 1 Airway Equipment and Method: Stylet and Oral airway Placement Confirmation: ETT inserted through vocal cords under direct vision, positive ETCO2 and breath sounds checked- equal and bilateral Secured at: 23 cm Tube secured with: Tape Dental Injury: Teeth and Oropharynx as per pre-operative assessment and Bloody posterior oropharynx

## 2022-02-23 NOTE — Transfer of Care (Signed)
Immediate Anesthesia Transfer of Care Note  Patient: Steven Adams  Procedure(s) Performed: ORGAN PROCUREMENT - Heart, Liver, Kidneys  Patient Location: morgue  Anesthesia Type:GA  Level of Consciousness: deceased  Airway & Oxygen Therapy: N/A  Post-op Assessment: deceased  Post vital signs: deceased  Last Vitals:  Vitals Value Taken Time  BP    Temp    Pulse    Resp    SpO2      Last Pain:  Vitals:   02/22/22 2000  TempSrc: Bladder  PainSc:          Complications: No notable events documented.

## 2022-02-23 NOTE — Significant Event (Addendum)
Time of death 2337 on 02/22/2022 Pronounced by Arlyn Leak, RN and Ryland Zack Seal

## 2022-02-24 LAB — URINE CULTURE: Culture: NO GROWTH

## 2022-02-24 NOTE — Anesthesia Postprocedure Evaluation (Signed)
Anesthesia Post Note  Patient: Steven Adams  Procedure(s) Performed: ORGAN PROCUREMENT - Heart, Liver, Kidneys     Anesthesia Type: General Anesthetic complications: no Comments: - DCD patient pronounced in OR   No notable events documented.  Last Vitals:  Vitals:   02/22/22 2145 02/22/22 2200  BP:    Pulse: 89 88  Resp: (!) 0 (!) 57  Temp: 36.8 C   SpO2: 99% 98%    Last Pain:  Vitals:   02/22/22 2000  TempSrc: Bladder  PainSc:                  Nelle Don Olla Delancey

## 2022-02-25 ENCOUNTER — Encounter (HOSPITAL_COMMUNITY): Payer: Self-pay

## 2022-02-25 LAB — CULTURE, BLOOD (ROUTINE X 2)
Culture: NO GROWTH
Culture: NO GROWTH
Special Requests: ADEQUATE
Special Requests: ADEQUATE

## 2022-02-25 DEATH — deceased

## 2022-02-26 ENCOUNTER — Encounter (HOSPITAL_COMMUNITY): Payer: Self-pay

## 2022-03-04 NOTE — Discharge Summary (Signed)
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   Special Requests NONE  Final   Culture   Final    NO GROWTH Performed at [facility], [address], [place], [place] [address]    Report Status 02/24/2022 FINAL  Final    Lab Basic Metabolic Panel: No results for input(s): "NA", "K", "CL", "CO2", "GLUCOSE", "BUN", "CREATININE", "CALCIUM", "MG", "PHOS" in the last 168 hours. Liver Function Tests: No results for input(s): "AST", "ALT", "ALKPHOS", "BILITOT", "PROT", "ALBUMIN" in the last 168 hours. No results for input(s):  "LIPASE", "AMYLASE" in the last 168 hours. No results for input(s): "AMMONIA" in the last 168 hours. CBC: No results for input(s): "WBC", "NEUTROABS", "HGB", "HCT", "MCV", "PLT" in the last 168 hours. Cardiac Enzymes: No results for input(s): "CKTOTAL", "CKMB", "CKMBINDEX", "TROPONINI" in the last 168 hours. Sepsis Labs: No results for input(s): "PROCALCITON", "WBC", "LATICACIDVEN" in the last 168 hours.  Procedures/Operations  Chest tube insertion, intubation, central line placement, arterial line placement, VV ECMO cannulation, organ procurement.    Cayce Paschal 03/04/2022, 7:46 AM

## 2022-03-16 LAB — ECHO TEE
Height: 70 in
Weight: 2240 oz
